# Patient Record
Sex: Male | Born: 1955 | Race: White | Hispanic: No | Smoking: Former smoker
Health system: Southern US, Community
[De-identification: ages and names within clinical notes are randomized; demographics above are authoritative.]

## PROBLEM LIST (undated history)

## (undated) DIAGNOSIS — K5730 Diverticulosis of large intestine without perforation or abscess without bleeding: Secondary | ICD-10-CM

## (undated) DIAGNOSIS — Z87898 Personal history of other specified conditions: Secondary | ICD-10-CM

## (undated) DIAGNOSIS — N400 Enlarged prostate without lower urinary tract symptoms: Secondary | ICD-10-CM

## (undated) DIAGNOSIS — E785 Hyperlipidemia, unspecified: Secondary | ICD-10-CM

## (undated) HISTORY — DX: Hyperlipidemia, unspecified: E78.5

## (undated) HISTORY — DX: Diverticulosis of large intestine without perforation or abscess without bleeding: K57.30

## (undated) HISTORY — PX: APPENDECTOMY: SHX54

## (undated) HISTORY — DX: Personal history of other specified conditions: Z87.898

## (undated) HISTORY — DX: Benign prostatic hyperplasia without lower urinary tract symptoms: N40.0

## (undated) HISTORY — PX: TONSILLECTOMY: SUR1361

---

## 2003-12-13 HISTORY — PX: COLONOSCOPY: SHX5424

## 2004-01-10 ENCOUNTER — Ambulatory Visit (HOSPITAL_COMMUNITY): Admission: RE | Admit: 2004-01-10 | Discharge: 2004-01-10 | Payer: Self-pay | Admitting: *Deleted

## 2005-03-12 ENCOUNTER — Ambulatory Visit: Payer: Self-pay | Admitting: Internal Medicine

## 2005-03-17 ENCOUNTER — Encounter (INDEPENDENT_AMBULATORY_CARE_PROVIDER_SITE_OTHER): Payer: Self-pay | Admitting: *Deleted

## 2005-03-17 ENCOUNTER — Ambulatory Visit: Payer: Self-pay | Admitting: Internal Medicine

## 2005-03-17 LAB — CONVERTED CEMR LAB: PSA: 1.69 ng/mL

## 2005-05-12 DIAGNOSIS — Z87898 Personal history of other specified conditions: Secondary | ICD-10-CM

## 2005-05-12 HISTORY — DX: Personal history of other specified conditions: Z87.898

## 2005-06-02 ENCOUNTER — Ambulatory Visit: Payer: Self-pay | Admitting: Internal Medicine

## 2005-06-05 ENCOUNTER — Ambulatory Visit: Payer: Self-pay | Admitting: Internal Medicine

## 2005-06-09 ENCOUNTER — Ambulatory Visit: Payer: Self-pay

## 2007-03-17 ENCOUNTER — Encounter (INDEPENDENT_AMBULATORY_CARE_PROVIDER_SITE_OTHER): Payer: Self-pay | Admitting: *Deleted

## 2007-03-17 DIAGNOSIS — Z9189 Other specified personal risk factors, not elsewhere classified: Secondary | ICD-10-CM | POA: Insufficient documentation

## 2007-03-18 ENCOUNTER — Ambulatory Visit: Payer: Self-pay | Admitting: Internal Medicine

## 2007-03-18 DIAGNOSIS — E782 Mixed hyperlipidemia: Secondary | ICD-10-CM | POA: Insufficient documentation

## 2007-03-18 DIAGNOSIS — N400 Enlarged prostate without lower urinary tract symptoms: Secondary | ICD-10-CM

## 2007-03-29 ENCOUNTER — Encounter (INDEPENDENT_AMBULATORY_CARE_PROVIDER_SITE_OTHER): Payer: Self-pay | Admitting: *Deleted

## 2007-10-19 ENCOUNTER — Ambulatory Visit: Payer: Self-pay | Admitting: Internal Medicine

## 2007-10-19 LAB — CONVERTED CEMR LAB
Glucose, Urine, Semiquant: NEGATIVE
Protein, U semiquant: NEGATIVE
Specific Gravity, Urine: <1.005
WBC Urine, dipstick: NEGATIVE
pH: 6.5

## 2007-11-09 ENCOUNTER — Telehealth (INDEPENDENT_AMBULATORY_CARE_PROVIDER_SITE_OTHER): Payer: Self-pay | Admitting: *Deleted

## 2008-03-26 ENCOUNTER — Ambulatory Visit: Payer: Self-pay | Admitting: Internal Medicine

## 2008-03-26 DIAGNOSIS — K5730 Diverticulosis of large intestine without perforation or abscess without bleeding: Secondary | ICD-10-CM | POA: Insufficient documentation

## 2008-03-27 ENCOUNTER — Encounter (INDEPENDENT_AMBULATORY_CARE_PROVIDER_SITE_OTHER): Payer: Self-pay | Admitting: *Deleted

## 2009-06-25 ENCOUNTER — Ambulatory Visit: Payer: Self-pay | Admitting: Internal Medicine

## 2010-02-09 LAB — CONVERTED CEMR LAB
AST: 18 units/L (ref 0–37)
BUN: 10 mg/dL (ref 6–23)
Basophils Absolute: 0 10*3/uL (ref 0.0–0.1)
Basophils Absolute: 0.1 10*3/uL (ref 0.0–0.1)
Basophils Relative: 0.5 % (ref 0.0–1.0)
Bilirubin, Direct: 0.1 mg/dL (ref 0.0–0.3)
Bilirubin, Direct: 0.1 mg/dL (ref 0.0–0.3)
CO2: 30 meq/L (ref 19–32)
Calcium: 9 mg/dL (ref 8.4–10.5)
Calcium: 9.1 mg/dL (ref 8.4–10.5)
Cholesterol, target level: 200 mg/dL
Cholesterol: 161 mg/dL (ref 0–200)
Cholesterol: 173 mg/dL (ref 0–200)
Eosinophils Absolute: 0.1 10*3/uL (ref 0.0–0.6)
Eosinophils Absolute: 0.1 10*3/uL (ref 0.0–0.7)
Eosinophils Relative: 1.9 % (ref 0.0–5.0)
GFR calc Af Amer: 130 mL/min
GFR calc Af Amer: 130 mL/min
GFR calc non Af Amer: 107 mL/min
GFR calc non Af Amer: 109.57 mL/min (ref >60)
Glucose, Bld: 91 mg/dL (ref 70–99)
Glucose, Bld: 96 mg/dL (ref 70–99)
HCT: 43.8 % (ref 39.0–52.0)
HDL: 42.1 mg/dL (ref >39.0)
HDL: 44.2 mg/dL (ref >39.0)
HDL: 44.7 mg/dL (ref >39.00)
Hemoglobin: 16.3 g/dL (ref 13.0–17.0)
Hemoglobin: 16.3 g/dL (ref 13.0–17.0)
LDL Cholesterol: 120 mg/dL — ABNORMAL HIGH (ref 0–99)
Lymphocytes Relative: 23.5 % (ref 12.0–46.0)
Lymphocytes Relative: 24.8 % (ref 12.0–46.0)
Lymphocytes Relative: 25.8 % (ref 12.0–46.0)
Lymphs Abs: 1.4 10*3/uL (ref 0.7–4.0)
MCHC: 34.8 g/dL (ref 30.0–36.0)
MCV: 89.2 fL (ref 78.0–100.0)
Monocytes Absolute: 0.5 10*3/uL (ref 0.1–1.0)
Monocytes Absolute: 0.6 10*3/uL (ref 0.2–0.7)
Monocytes Relative: 6.9 % (ref 3.0–12.0)
Neutro Abs: 4.6 10*3/uL (ref 1.4–7.7)
Neutro Abs: 4.8 10*3/uL (ref 1.4–7.7)
PSA: 1.33 ng/mL (ref 0.10–4.00)
Platelets: 231 10*3/uL (ref 150.0–400.0)
Potassium: 4.6 meq/L (ref 3.5–5.1)
RDW: 12.1 % (ref 11.5–14.6)
RDW: 13 % (ref 11.5–14.6)
Sodium: 140 meq/L (ref 135–145)
Sodium: 144 meq/L (ref 135–145)
TSH: 0.81 microintl units/mL (ref 0.35–5.50)
TSH: 1.08 microintl units/mL (ref 0.35–5.50)
TSH: 1.2 microintl units/mL (ref 0.35–5.50)
Total Bilirubin: 0.6 mg/dL (ref 0.3–1.2)
Total Bilirubin: 1 mg/dL (ref 0.3–1.2)
Total Protein: 6.9 g/dL (ref 6.0–8.3)
Triglycerides: 55 mg/dL (ref 0–149)
Triglycerides: 70 mg/dL (ref 0–149)
VLDL: 11.8 mg/dL (ref 0.0–40.0)
VLDL: 14 mg/dL (ref 0–40)
WBC: 7.1 10*3/uL (ref 4.5–10.5)

## 2010-02-11 NOTE — Assessment & Plan Note (Signed)
Summary: cpx//pt will come in fasting//lch   Vital Signs:  Patient profile:   55 year old male Height:      68 inches Weight:      197.8 pounds BMI:     30.18 Temp:     98.1 degrees F oral Pulse rate:   60 / minute Resp:     14 per minute BP sitting:   122 / 80  (left arm) Cuff size:   large  Vitals Entered By: Shonna Chock (June 25, 2009 8:44 AM)  CC: Lipid Management Comments REVIEWED MED LIST, PATIENT AGREED DOSE AND INSTRUCTION CORRECT    CC:  Lipid Management.  History of Present Illness: Jason Humphrey is here for a physical; he is asymptomatic.  Lipid Management History:      Positive NCEP/ATP III risk factors include male age 59 years old or older.  Negative NCEP/ATP III risk factors include non-diabetic, no family history for ischemic heart disease, non-tobacco-user status, non-hypertensive, no ASHD (atherosclerotic heart disease), no prior stroke/TIA, no peripheral vascular disease, and no history of aortic aneurysm.     Allergies: 1)  ! Jonne Ply With Caffiene 2)  ! Relafen  Past History:  Past Medical History: Hyperlipidemia: NMR 2007: LDL 126(1515/1163), HDL 41, TG 76. LDL goal = < 110, ideally < 85. Framingham Study LDL goal = < 160. Benign prostatic hypertrophy chest  pain 05/2005, Nuclear Stress Test negative  for ischemia (? mild LVH,? low normal EF) chondrocalcinosis Diverticulosis, colon  Past Surgical History: Appendectomy Tonsillectomy Colonoscopy Diverticulosis 12/2003 , Dr Sabino Gasser [facility] reviewed for relevance  Family History: Father: Parkinson's Disease Mother: peripheral neuropathy, HTN Siblings: sister: malignant  pituitary  tumor, deceased; sister: THR PGF:  Alcoholism; P uncles: HTN  Social History: Former Smoker:quit 1990 Occupation:[employer] Alcohol use-no Regular exercise-yes:2X/week as biking & rollerblading Married  Review of Systems  The patient denies anorexia, fever, weight loss, weight gain, vision loss, decreased  hearing, hoarseness, chest pain, syncope, dyspnea on exertion, peripheral edema, prolonged cough, headaches, hemoptysis, abdominal pain, melena, hematochezia, severe indigestion/heartburn, suspicious skin lesions, depression, unusual weight change, abnormal bleeding, enlarged lymph nodes, and angioedema.   GU:  Denies discharge, dysuria, hematuria, nocturia, urinary frequency, and urinary hesitancy; Symptoms improved on Green Tea & Saw Palmetto Extract.  Physical Exam  General:  well-nourished; alert,appropriate and cooperative throughout examination Head:  Normocephalic and atraumatic without obvious abnormalities. No apparent alopecia ; moustache. Eyes:  No corneal or conjunctival inflammation noted. Perrla. Funduscopic exam benign, without hemorrhages, exudates or papilledema. Ears:  External ear exam shows no significant lesions or deformities.  Otoscopic examination reveals clear canals, tympanic membranes are intact bilaterally without bulging, retraction, inflammation or discharge. Hearing is grossly normal bilaterally. Nose:  External nasal examination shows no deformity or inflammation. Nasal mucosa are pink and moist without lesions or exudates. Slight septal deviation Mouth:  Oral mucosa and oropharynx without lesions or exudates.  Teeth in good repair. Neck:  No deformities, masses, or tenderness noted. Lungs:  Normal respiratory effort, chest expands symmetrically. Lungs are clear to auscultation, no crackles or wheezes. Heart:  regular rhythm, no murmur, no gallop, no rub, no JVD, and bradycardia.   S4 Abdomen:  Bowel sounds positive,abdomen soft and non-tender without masses, organomegaly or hernias noted. Well healed op scar Rectal:  No external abnormalities noted. Normal sphincter tone. No rectal masses or tenderness. Genitalia:  Testes bilaterally descended without nodularity, tenderness or masses. No scrotal masses or lesions. No penis lesions or urethral discharge. Prostate:  no  nodules, no asymmetry, no induration, but  2+ enlarged.   Msk:  No deformity or scoliosis noted of thoracic or lumbar spine.   Pulses:  R and L carotid,radial,dorsalis pedis and posterior tibial pulses are full and equal bilaterally Extremities:  No clubbing, cyanosis, edema, or deformity noted with normal full range of motion of all joints.   Fungal nail changes Neurologic:  alert & oriented X3 and DTRs symmetrical and normal.   Skin:  Intact without suspicious lesions or rashes Cervical Nodes:  No lymphadenopathy noted Axillary Nodes:  No palpable lymphadenopathy Inguinal Nodes:  No significant adenopathy Psych:  memory intact for recent and remote, normally interactive, and good eye contact.     Impression & Recommendations:  Problem # 1:  ROUTINE GENERAL MEDICAL EXAM@HEALTH  CARE FACL (ICD-V70.0)  Orders: EKG w/ Interpretation (93000) Venipuncture (04540) TLB-Lipid Panel (80061-LIPID) TLB-BMP (Basic Metabolic Panel-BMET) (80048-METABOL) TLB-CBC Platelet - w/Differential (85025-CBCD) TLB-Hepatic/Liver Function Pnl (80076-HEPATIC) TLB-TSH (Thyroid Stimulating Hormone) (84443-TSH) TLB-PSA (Prostate Specific Antigen) (84153-PSA)  Problem # 2:  HYPERLIPIDEMIA (ICD-272.2)  Orders: EKG w/ Interpretation (93000) Venipuncture (98119) TLB-Lipid Panel (80061-LIPID)  Problem # 3:  BENIGN PROSTATIC HYPERTROPHY (ICD-600.00)  His updated medication list for this problem includes:    Flomax 0.4 Mg Xr24h-cap (Tamsulosin hcl) .Marland Kitchen... 1 by mouth every other day  Orders: Venipuncture (14782) TLB-PSA (Prostate Specific Antigen) (84153-PSA)  Problem # 4:  DIVERTICULOSIS, COLON (ICD-562.10)  Complete Medication List: 1)  Flomax 0.4 Mg Xr24h-cap (Tamsulosin hcl) .Marland Kitchen.. 1 by mouth every other day  Lipid Assessment/Plan:      Based on NCEP/ATP III, the patient's risk factor category is "0-1 risk factors".  The patient's lipid goals are as follows: Total cholesterol goal is 200; LDL cholesterol  goal is 110; HDL cholesterol goal is 40; Triglyceride goal is 150.  His LDL cholesterol goal has been met.  Secondary causes for hyperlipidemia have been ruled out.  He has been counseled on adjunctive measures for lowering his cholesterol and has been provided with dietary instructions.    Patient Instructions: 1)  It is important that you exercise regularly at least 20 minutes 5 times a week. If you develop chest pain, have severe difficulty breathing, or feel very tired , stop exercising immediately and seek medical attention. 2)  Take an 81 mg  coated Aspirin every day.

## 2010-05-30 NOTE — Op Note (Signed)
[name], Jason Humphrey NO.:  [id number]   MEDICAL RECORD NO.:  [id number]          PATIENT TYPE:  AMB   LOCATION:  ENDO                         FACILITY:  [facility]   PHYSICIAN:  Georgiana Spinner, M.D.    DATE OF BIRTH:  1955/11/27   DATE OF PROCEDURE:  01/10/2004  DATE OF DISCHARGE:                                 OPERATIVE REPORT   PROCEDURE:  Colonoscopy.   INDICATIONS:  Colon cancer screening.   ANESTHESIA:  Demerol 75 mg, Versed 10 mg.   PROCEDURE:  With the patient mildly stated in the left lateral decubitus  position, a rectal examination was performed which was unremarkable.  The  prostate felt normal.  Subsequently the Olympus video colonoscope was  inserted in the rectum and passed under direct vision to the cecum,  identified by ileocecal valve and appendiceal orifice, both of which were  photographed.  From this point the colonoscope was slowly withdrawn taking  circumferential views of the colonic mucosa, stopping only in the sigmoid  colon to photograph an area of diverticulosis that was noted, fairly mild,  until we reached the rectum which appeared normal, in the rectum it showed  hemorrhoids on retroflex view.  The endoscope was straightened and  withdrawn.  The patient's vital signs and pulses remained stable.  The  patient tolerated the procedure well and there were no apparent  complications.   FINDINGS:  Diverticulosis of sigmoid colon, internal hemorrhoids, otherwise  an unremarkable examination.   PLAN:  Consider repeat examination in 5 to 10 years.       GMO/MEDQ  D:  01/10/2004  T:  01/10/2004  Job:  [id number]   cc:   Titus Dubin. Alwyn Ren, M.D. [facility]

## 2010-07-26 ENCOUNTER — Encounter: Payer: Self-pay | Admitting: Internal Medicine

## 2010-07-30 ENCOUNTER — Encounter: Payer: Self-pay | Admitting: Internal Medicine

## 2011-09-07 ENCOUNTER — Encounter (HOSPITAL_COMMUNITY): Payer: Self-pay | Admitting: *Deleted

## 2011-09-07 ENCOUNTER — Emergency Department (HOSPITAL_COMMUNITY)
Admission: EM | Admit: 2011-09-07 | Discharge: 2011-09-07 | Disposition: A | Discharge status: Home / Self Care | Payer: Self-pay | Attending: Emergency Medicine | Admitting: Emergency Medicine

## 2011-09-07 DIAGNOSIS — E785 Hyperlipidemia, unspecified: Secondary | ICD-10-CM | POA: Insufficient documentation

## 2011-09-07 DIAGNOSIS — K644 Residual hemorrhoidal skin tags: Secondary | ICD-10-CM | POA: Insufficient documentation

## 2011-09-07 DIAGNOSIS — Z87891 Personal history of nicotine dependence: Secondary | ICD-10-CM | POA: Insufficient documentation

## 2011-09-07 DIAGNOSIS — K649 Unspecified hemorrhoids: Secondary | ICD-10-CM

## 2011-09-07 DIAGNOSIS — N400 Enlarged prostate without lower urinary tract symptoms: Secondary | ICD-10-CM | POA: Insufficient documentation

## 2011-09-07 MED ORDER — HYDROCORTISONE ACE-PRAMOXINE 1-1 % RE FOAM: 1.0000 | Freq: Two times a day (BID) | RECTAL | Status: AC

## 2011-09-07 NOTE — ED Provider Notes (Signed)
History     CSN: [id number]  Arrival date & time 09/07/11  8295   First MD Initiated Contact with Patient 09/07/11 2019      Chief Complaint  Patient presents with  . Rectal Bleeding    (Consider location/radiation/quality/duration/timing/severity/associated sxs/prior treatment) Patient is a 56 y.o. male presenting with hematochezia. The history is provided by the patient.  Rectal Bleeding    patient here with bright red blood noted on toilet paper. History of hemorrhoids in the past denies any abdominal pain or dizziness with standing. Was attempting to move his bowels today was having increased constipation when he had sudden gush of bright red blood which has since resolved. Denies any vomiting or fever.  Past Medical History  Diagnosis Date  . Hyperlipidemia   . Benign prostatic hypertrophy   . Diverticulosis of colon   . History of chest pain 05/2005    Nuclear stress test neg for ischemia (?mild LVH, ? low normal EF)    Past Surgical History  Procedure Date  . Appendectomy   . Tonsillectomy   . Colonoscopy 12/2003    Diverticulosis, Dr.George Virginia [place]     Family History  Problem Relation Age of Onset  . Parkinsonism Father   . Neuropathy Mother   . Hypertension Mother   . Alcohol abuse Paternal Grandfather   . Hypertension Paternal Uncle     History  Substance Use Topics  . Smoking status: Former Smoker    Quit date: 01/13/1988  . Smokeless tobacco: Not on file  . Alcohol Use: No      Review of Systems  Gastrointestinal: Positive for hematochezia.  All other systems reviewed and are negative.    Allergies  Nabumetone  Home Medications  No current outpatient prescriptions on file.  BP 113/85  Pulse 77  Temp 98.4 F (36.9 C) (Oral)  Resp 16  Ht 5\' 10"  (1.778 m)  Wt 180 lb (81.647 kg)  BMI 25.83 kg/m2  SpO2 97%  Physical Exam  Nursing note and vitals reviewed. Constitutional: He is oriented to person, place, and time. He appears  well-developed and well-nourished.  Non-toxic appearance. No distress.  HENT:  Head: Normocephalic and atraumatic.  Eyes: Conjunctivae, EOM and lids are normal. Pupils are equal, round, and reactive to light.  Neck: Normal range of motion. Neck supple. No tracheal deviation present. No mass present.  Cardiovascular: Normal rate, regular rhythm and normal heart sounds.  Exam reveals no gallop.   No murmur heard. Pulmonary/Chest: Effort normal and breath sounds normal. No stridor. No respiratory distress. He has no decreased breath sounds. He has no wheezes. He has no rhonchi. He has no rales.  Abdominal: Soft. Normal appearance and bowel sounds are normal. He exhibits no distension. There is no tenderness. There is no rebound and no CVA tenderness.  Genitourinary:       Moderate size external hemorrhoid noted. Dried blood appreciated with no active bleeding. Good sphincter tone  Musculoskeletal: Normal range of motion. He exhibits no edema and no tenderness.  Neurological: He is alert and oriented to person, place, and time. He has normal strength. No cranial nerve deficit or sensory deficit. GCS eye subscore is 4. GCS verbal subscore is 5. GCS motor subscore is 6.  Skin: Skin is warm and dry. No abrasion and no rash noted.  Psychiatric: He has a normal mood and affect. His speech is normal and behavior is normal.    ED Course  Procedures (including critical care time)  Labs Reviewed -  No data to display No results found.   No diagnosis found.    MDM  Patient to be treated for hemorrhoids and given referral to surgery. We'll also give patient Anusol and Proctofoam.        Toy Baker, MD 09/07/11 [phone]

## 2011-09-07 NOTE — ED Notes (Signed)
Pt from home with hx of hemmoroids years ago sts he saw bright red blood in the toilet earlier today and has seen blood on the toilet paper x 1 week. Sts since last week he has a place on his rectum that has been irritated. Pt sts he has done no heavy lifting recently.

## 2013-01-03 ENCOUNTER — Encounter (HOSPITAL_COMMUNITY): Payer: Self-pay | Admitting: Emergency Medicine

## 2013-01-03 ENCOUNTER — Emergency Department (HOSPITAL_COMMUNITY)
Admission: EM | Admit: 2013-01-03 | Discharge: 2013-01-03 | Disposition: A | Discharge status: Home / Self Care | Payer: Self-pay | Attending: Emergency Medicine | Admitting: Emergency Medicine

## 2013-01-03 DIAGNOSIS — R42 Dizziness and giddiness: Secondary | ICD-10-CM | POA: Insufficient documentation

## 2013-01-03 DIAGNOSIS — Z87891 Personal history of nicotine dependence: Secondary | ICD-10-CM | POA: Insufficient documentation

## 2013-01-03 DIAGNOSIS — Z87448 Personal history of other diseases of urinary system: Secondary | ICD-10-CM | POA: Insufficient documentation

## 2013-01-03 DIAGNOSIS — Z862 Personal history of diseases of the blood and blood-forming organs and certain disorders involving the immune mechanism: Secondary | ICD-10-CM | POA: Insufficient documentation

## 2013-01-03 DIAGNOSIS — Z8719 Personal history of other diseases of the digestive system: Secondary | ICD-10-CM | POA: Insufficient documentation

## 2013-01-03 DIAGNOSIS — Z8639 Personal history of other endocrine, nutritional and metabolic disease: Secondary | ICD-10-CM | POA: Insufficient documentation

## 2013-01-03 DIAGNOSIS — R5381 Other malaise: Secondary | ICD-10-CM | POA: Insufficient documentation

## 2013-01-03 MED ORDER — ONDANSETRON HCL 4 MG/2ML IJ SOLN
4.0000 mg | Freq: Once | INTRAMUSCULAR | Status: AC
  Administered 2013-01-03: 4 mg via INTRAVENOUS
  Filled 2013-01-03: qty 2

## 2013-01-03 MED ORDER — PROMETHAZINE HCL 25 MG PO TABS: 25.0000 mg | ORAL_TABLET | Freq: Three times a day (TID) | ORAL | Status: DC | PRN

## 2013-01-03 MED ORDER — SODIUM CHLORIDE 0.9 % IV BOLUS (SEPSIS)
1000.0000 mL | Freq: Once | INTRAVENOUS | Status: AC
  Administered 2013-01-03: 1000 mL via INTRAVENOUS

## 2013-01-03 MED ORDER — MECLIZINE HCL 25 MG PO TABS
25.0000 mg | ORAL_TABLET | Freq: Once | ORAL | Status: AC
  Administered 2013-01-03: 25 mg via ORAL
  Filled 2013-01-03: qty 1

## 2013-01-03 MED ORDER — MECLIZINE HCL 25 MG PO TABS: ORAL_TABLET | ORAL | Status: DC

## 2013-01-03 NOTE — ED Provider Notes (Signed)
CSN: [id number]     Arrival date & time 01/03/13  0913 History   First MD Initiated Contact with Patient 01/03/13 [phone]     Chief Complaint  Patient presents with  . Nausea   (Consider location/radiation/quality/duration/timing/severity/associated sxs/prior Treatment) HPI Patient states yesterday about 12:30 in the afternoon he started getting gradually worsening nausea with Annice Pih. He also states he feels weak and dizzy. He states he can't stay in the because he feels like he is spinning. He states if he moves his head rapidly spinning gets worse. He states if he lays still it feels better. He denies any headache. He states he had double vision yesterday but not today. He denies numbness in his extremities. He states when he walks he feels off balance. He denies chest pain, shortness of breath, abdominal pain, or diarrhea. He states he feels off balance when he tries to walk. He states he had episode of vertigo about 15 years ago that was much more intense with a spinning than what he's having today.  Family history is negative for strokes  PCP Dr Alwyn Ren hasn't seen in years  Past Medical History  Diagnosis Date  . Hyperlipidemia   . Benign prostatic hypertrophy   . Diverticulosis of colon   . History of chest pain 05/2005    Nuclear stress test neg for ischemia (?mild LVH, ? low normal EF)   Past Surgical History  Procedure Laterality Date  . Appendectomy    . Tonsillectomy    . Colonoscopy  12/2003    Diverticulosis, Dr.George Virginia [place]    Family History  Problem Relation Age of Onset  . Parkinsonism Father   . Neuropathy Mother   . Hypertension Mother   . Alcohol abuse Paternal Grandfather   . Hypertension Paternal Uncle    History  Substance Use Topics  . Smoking status: Former Smoker    Quit date: 01/13/1988  . Smokeless tobacco: Not on file  . Alcohol Use: No  unemployed dump [employer]  Review of Systems  All other systems reviewed and are negative.    Allergies   Nabumetone  Home Medications  No current outpatient prescriptions on file.   BP 159/84  Pulse 89  Temp(Src) 97.5 F (36.4 C) (Oral)  SpO2 95%  Vital signs normal   Physical Exam  Nursing note and vitals reviewed. Constitutional: He is oriented to person, place, and time. He appears well-developed and well-nourished.  Non-toxic appearance. He does not appear ill. No distress.  HENT:  Head: Normocephalic and atraumatic.  Right Ear: External ear normal.  Left Ear: External ear normal.  Nose: Nose normal. No mucosal edema or rhinorrhea.  Mouth/Throat: Oropharynx is clear and moist and mucous membranes are normal. No dental abscesses or uvula swelling.  Eyes: Conjunctivae and EOM are normal. Pupils are equal, round, and reactive to light.  Rare nystagmus seen when patient sits up  Neck: Normal range of motion and full passive range of motion without pain. Neck supple.  Cardiovascular: Normal rate, regular rhythm and normal heart sounds.  Exam reveals no gallop and no friction rub.   No murmur heard. Pulmonary/Chest: Effort normal and breath sounds normal. No respiratory distress. He has no wheezes. He has no rhonchi. He has no rales. He exhibits no tenderness and no crepitus.  Abdominal: Soft. Normal appearance and bowel sounds are normal. He exhibits no distension. There is no tenderness. There is no rebound and no guarding.  Musculoskeletal: Normal range of motion. He exhibits no edema and  no tenderness.  Moves all extremities well.   Neurological: He is alert and oriented to person, place, and time. He has normal strength. No cranial nerve deficit.  Skin: Skin is warm, dry and intact. No rash noted. No erythema. No pallor.  Psychiatric: He has a normal mood and affect. His speech is normal and behavior is normal. His mood appears not anxious.  Flat affect    ED Course  Procedures (including critical care time)  Medications  sodium chloride 0.9 % bolus 1,000 mL (0 mLs  Intravenous Stopped 01/03/13 1130)  ondansetron (ZOFRAN) injection 4 mg (4 mg Intravenous Given 01/03/13 0956)  meclizine (ANTIVERT) tablet 25 mg (25 mg Oral Given 01/03/13 0956)  ondansetron (ZOFRAN) injection 4 mg (4 mg Intravenous Given 01/03/13 1126)  meclizine (ANTIVERT) tablet 25 mg (25 mg Oral Given 01/03/13 1126)    11:10 Feeling better, still has mild nausea and mild dizziness when he moves his head.   11:30 pt ambulated by nursing staff without difficulty.   Labs Review Labs Reviewed - No data to display Imaging Review No results found.  EKG Interpretation   None       MDM   1. Vertigo     New Prescriptions   MECLIZINE (ANTIVERT) 25 MG TABLET    Take 1 or 2 po Q 6hrs for dizziness   PROMETHAZINE (PHENERGAN) 25 MG TABLET    Take 1 tablet (25 mg total) by mouth every 8 (eight) hours as needed for nausea or vomiting.    Plan discharge   Devoria Albe, MD, Franz Dell, MD 01/03/13 1213

## 2013-01-03 NOTE — ED Notes (Addendum)
Pt arrived EMS with complaints of nausea for the 24 hours. Pt denies dizziness, vomitiming, illness, injury, or LOC.

## 2013-01-03 NOTE — ED Notes (Signed)
Pt ambulated in hallway to bathroom w/o assistance.

## 2013-01-03 NOTE — ED Notes (Signed)
Bed: RESB Expected date:  Expected time:  Means of arrival:  Comments: Nausea, dizziness/to go to 14 when DC

## 2013-01-03 NOTE — Progress Notes (Signed)
[facility] provided pt with a list of primary care resources, a [facility] [employer] Card application, and [facility] information.  °

## 2016-09-12 ENCOUNTER — Emergency Department (HOSPITAL_COMMUNITY): Payer: Self-pay

## 2016-09-12 ENCOUNTER — Encounter (HOSPITAL_COMMUNITY): Payer: Self-pay | Admitting: *Deleted

## 2016-09-12 ENCOUNTER — Emergency Department (HOSPITAL_COMMUNITY): Admit: 2016-09-12 | Payer: Self-pay

## 2016-09-12 ENCOUNTER — Ambulatory Visit (HOSPITAL_COMMUNITY)
Admission: EM | Admit: 2016-09-12 | Discharge: 2016-09-12 | Disposition: A | Discharge status: Home / Self Care | Payer: Self-pay | Attending: Nurse Practitioner | Admitting: Nurse Practitioner

## 2016-09-12 ENCOUNTER — Inpatient Hospital Stay (HOSPITAL_COMMUNITY)
Admission: EM | Admit: 2016-09-12 | Discharge: 2016-09-16 | DRG: 812 | Disposition: A | Discharge status: Home / Self Care | Payer: Self-pay | Attending: Internal Medicine | Admitting: Internal Medicine

## 2016-09-12 ENCOUNTER — Emergency Department (HOSPITAL_COMMUNITY): Admit: 2016-09-12 | Discharge: 2016-09-12 | Disposition: A | Discharge status: Home / Self Care | Payer: Self-pay

## 2016-09-12 ENCOUNTER — Encounter (HOSPITAL_COMMUNITY): Payer: Self-pay | Admitting: Family Medicine

## 2016-09-12 DIAGNOSIS — N179 Acute kidney failure, unspecified: Secondary | ICD-10-CM | POA: Diagnosis present

## 2016-09-12 DIAGNOSIS — K5790 Diverticulosis of intestine, part unspecified, without perforation or abscess without bleeding: Secondary | ICD-10-CM | POA: Insufficient documentation

## 2016-09-12 DIAGNOSIS — E785 Hyperlipidemia, unspecified: Secondary | ICD-10-CM | POA: Insufficient documentation

## 2016-09-12 DIAGNOSIS — E876 Hypokalemia: Secondary | ICD-10-CM | POA: Diagnosis present

## 2016-09-12 DIAGNOSIS — E861 Hypovolemia: Secondary | ICD-10-CM | POA: Diagnosis present

## 2016-09-12 DIAGNOSIS — R600 Localized edema: Secondary | ICD-10-CM | POA: Diagnosis present

## 2016-09-12 DIAGNOSIS — Z8249 Family history of ischemic heart disease and other diseases of the circulatory system: Secondary | ICD-10-CM

## 2016-09-12 DIAGNOSIS — E872 Acidosis: Secondary | ICD-10-CM | POA: Diagnosis present

## 2016-09-12 DIAGNOSIS — M7989 Other specified soft tissue disorders: Secondary | ICD-10-CM | POA: Insufficient documentation

## 2016-09-12 DIAGNOSIS — D649 Anemia, unspecified: Principal | ICD-10-CM | POA: Diagnosis present

## 2016-09-12 DIAGNOSIS — R0602 Shortness of breath: Secondary | ICD-10-CM | POA: Insufficient documentation

## 2016-09-12 DIAGNOSIS — N400 Enlarged prostate without lower urinary tract symptoms: Secondary | ICD-10-CM | POA: Diagnosis present

## 2016-09-12 DIAGNOSIS — R17 Unspecified jaundice: Secondary | ICD-10-CM

## 2016-09-12 DIAGNOSIS — R609 Edema, unspecified: Secondary | ICD-10-CM

## 2016-09-12 DIAGNOSIS — Z87891 Personal history of nicotine dependence: Secondary | ICD-10-CM

## 2016-09-12 DIAGNOSIS — E871 Hypo-osmolality and hyponatremia: Secondary | ICD-10-CM | POA: Diagnosis present

## 2016-09-12 DIAGNOSIS — I959 Hypotension, unspecified: Secondary | ICD-10-CM | POA: Diagnosis present

## 2016-09-12 DIAGNOSIS — R531 Weakness: Secondary | ICD-10-CM

## 2016-09-12 DIAGNOSIS — E860 Dehydration: Secondary | ICD-10-CM

## 2016-09-12 DIAGNOSIS — R42 Dizziness and giddiness: Secondary | ICD-10-CM | POA: Insufficient documentation

## 2016-09-12 LAB — COMPREHENSIVE METABOLIC PANEL
ALT: 20 U/L (ref 17–63)
ALT: 17 U/L (ref 17–63)
ANION GAP: 15 (ref 5–15)
AST: 50 U/L — AB (ref 15–41)
AST: 26 U/L (ref 15–41)
Albumin: 3 g/dL — ABNORMAL LOW (ref 3.5–5.0)
Albumin: 3 g/dL — ABNORMAL LOW (ref 3.5–5.0)
Alkaline Phosphatase: 78 U/L (ref 38–126)
Alkaline Phosphatase: 71 U/L (ref 38–126)
Anion gap: 17 — ABNORMAL HIGH (ref 5–15)
BILIRUBIN TOTAL: 3.3 mg/dL — AB (ref 0.3–1.2)
BUN: 23 mg/dL — ABNORMAL HIGH (ref 6–20)
BUN: 25 mg/dL — ABNORMAL HIGH (ref 6–20)
CO2: 14 mmol/L — ABNORMAL LOW (ref 22–32)
CO2: 15 mmol/L — ABNORMAL LOW (ref 22–32)
CREATININE: 1.51 mg/dL — AB (ref 0.61–1.24)
Calcium: 8.5 mg/dL — ABNORMAL LOW (ref 8.9–10.3)
Calcium: 8.2 mg/dL — ABNORMAL LOW (ref 8.9–10.3)
Chloride: 99 mmol/L — ABNORMAL LOW (ref 101–111)
Chloride: 99 mmol/L — ABNORMAL LOW (ref 101–111)
Creatinine, Ser: 1.6 mg/dL — ABNORMAL HIGH (ref 0.61–1.24)
GFR calc Af Amer: 52 mL/min — ABNORMAL LOW (ref >60)
GFR calc non Af Amer: 45 mL/min — ABNORMAL LOW (ref >60)
GFR, EST AFRICAN AMERICAN: 55 mL/min — AB (ref >60)
GFR, EST NON AFRICAN AMERICAN: 48 mL/min — AB (ref >60)
Glucose, Bld: 135 mg/dL — ABNORMAL HIGH (ref 65–99)
Glucose, Bld: 115 mg/dL — ABNORMAL HIGH (ref 65–99)
POTASSIUM: 4.4 mmol/L (ref 3.5–5.1)
POTASSIUM: 4.4 mmol/L (ref 3.5–5.1)
Sodium: 130 mmol/L — ABNORMAL LOW (ref 135–145)
Sodium: 129 mmol/L — ABNORMAL LOW (ref 135–145)
TOTAL PROTEIN: 6.7 g/dL (ref 6.5–8.1)
Total Bilirubin: 2.9 mg/dL — ABNORMAL HIGH (ref 0.3–1.2)
Total Protein: 6.4 g/dL — ABNORMAL LOW (ref 6.5–8.1)

## 2016-09-12 LAB — AMMONIA: Ammonia: 24 umol/L (ref 9–35)

## 2016-09-12 LAB — PROTIME-INR
INR: 1.27
Prothrombin Time: 15.8 seconds — ABNORMAL HIGH (ref 11.4–15.2)

## 2016-09-12 LAB — I-STAT CHEM 8, ED
BUN: 23 mg/dL — ABNORMAL HIGH (ref 6–20)
Calcium, Ion: 1.05 mmol/L — ABNORMAL LOW (ref 1.15–1.40)
Chloride: 101 mmol/L (ref 101–111)
Creatinine, Ser: 1.2 mg/dL (ref 0.61–1.24)
Glucose, Bld: 114 mg/dL — ABNORMAL HIGH (ref 65–99)
HCT: 27 % — ABNORMAL LOW (ref 39.0–52.0)
HEMOGLOBIN: 9.2 g/dL — AB (ref 13.0–17.0)
Potassium: 4.1 mmol/L (ref 3.5–5.1)
Sodium: 131 mmol/L — ABNORMAL LOW (ref 135–145)
TCO2: 15 mmol/L — ABNORMAL LOW (ref 22–32)

## 2016-09-12 LAB — URINALYSIS, ROUTINE W REFLEX MICROSCOPIC
Bilirubin Urine: NEGATIVE
Glucose, UA: 50 mg/dL — AB
Ketones, ur: 20 mg/dL — AB
LEUKOCYTES UA: NEGATIVE
Nitrite: NEGATIVE
PROTEIN: NEGATIVE mg/dL
SPECIFIC GRAVITY, URINE: 1.03 (ref 1.005–1.030)
SQUAMOUS EPITHELIAL / LPF: NONE SEEN
pH: 5 (ref 5.0–8.0)

## 2016-09-12 LAB — I-STAT VENOUS BLOOD GAS, ED
ACID-BASE DEFICIT: 8 mmol/L — AB (ref 0.0–2.0)
Acid-base deficit: 11 mmol/L — ABNORMAL HIGH (ref 0.0–2.0)
BICARBONATE: 15.7 mmol/L — AB (ref 20.0–28.0)
Bicarbonate: 13.6 mmol/L — ABNORMAL LOW (ref 20.0–28.0)
O2 Saturation: 90 %
O2 Saturation: 36 %
PH VEN: 7.314 (ref 7.250–7.430)
PH VEN: 7.393 (ref 7.250–7.430)
TCO2: 14 mmol/L — ABNORMAL LOW (ref 22–32)
TCO2: 16 mmol/L — AB (ref 22–32)
pCO2, Ven: 26.9 mmHg — ABNORMAL LOW (ref 44.0–60.0)
pCO2, Ven: 25.8 mmHg — ABNORMAL LOW (ref 44.0–60.0)
pO2, Ven: 62 mmHg — ABNORMAL HIGH (ref 32.0–45.0)
pO2, Ven: 21 mmHg — CL (ref 32.0–45.0)

## 2016-09-12 LAB — LIPASE, BLOOD: LIPASE: 33 U/L (ref 11–51)

## 2016-09-12 LAB — CBG MONITORING, ED: GLUCOSE-CAPILLARY: 95 mg/dL (ref 65–99)

## 2016-09-12 LAB — CBC
HCT: 27.1 % — ABNORMAL LOW (ref 39.0–52.0)
HEMOGLOBIN: 8.2 g/dL — AB (ref 13.0–17.0)
MCH: 25 pg — ABNORMAL LOW (ref 26.0–34.0)
MCHC: 30.3 g/dL (ref 30.0–36.0)
MCV: 82.6 fL (ref 78.0–100.0)
Platelets: 440 10*3/uL — ABNORMAL HIGH (ref 150–400)
RBC: 3.28 MIL/uL — ABNORMAL LOW (ref 4.22–5.81)
RDW: 17 % — AB (ref 11.5–15.5)
WBC: 7.2 10*3/uL (ref 4.0–10.5)

## 2016-09-12 LAB — CBC WITH DIFFERENTIAL/PLATELET
Basophils Absolute: 0 10*3/uL (ref 0.0–0.1)
Basophils Relative: 0 %
Eosinophils Absolute: 0 10*3/uL (ref 0.0–0.7)
Eosinophils Relative: 0 %
HCT: 27.7 % — ABNORMAL LOW (ref 39.0–52.0)
Hemoglobin: 8.4 g/dL — ABNORMAL LOW (ref 13.0–17.0)
LYMPHS ABS: 0.9 10*3/uL (ref 0.7–4.0)
LYMPHS PCT: 11 %
MCH: 24.9 pg — AB (ref 26.0–34.0)
MCHC: 30.3 g/dL (ref 30.0–36.0)
MCV: 82.2 fL (ref 78.0–100.0)
Monocytes Absolute: 0.5 10*3/uL (ref 0.1–1.0)
Monocytes Relative: 6 %
NEUTROS ABS: 6.2 10*3/uL (ref 1.7–7.7)
Neutrophils Relative %: 83 %
Platelets: 553 10*3/uL — ABNORMAL HIGH (ref 150–400)
RBC: 3.37 MIL/uL — ABNORMAL LOW (ref 4.22–5.81)
RDW: 17.2 % — ABNORMAL HIGH (ref 11.5–15.5)
WBC: 7.5 10*3/uL (ref 4.0–10.5)

## 2016-09-12 LAB — I-STAT CG4 LACTIC ACID, ED
LACTIC ACID, VENOUS: 4.24 mmol/L — AB (ref 0.5–1.9)
Lactic Acid, Venous: 2.65 mmol/L (ref 0.5–1.9)

## 2016-09-12 LAB — I-STAT TROPONIN, ED: TROPONIN I, POC: 0 ng/mL (ref 0.00–0.08)

## 2016-09-12 LAB — GAMMA GT: GGT: 20 U/L (ref 7–50)

## 2016-09-12 LAB — D-DIMER, QUANTITATIVE (NOT AT ARMC): D DIMER QUANT: 1.42 ug{FEU}/mL — AB (ref 0.00–0.50)

## 2016-09-12 LAB — BRAIN NATRIURETIC PEPTIDE: B Natriuretic Peptide: 67.9 pg/mL (ref 0.0–100.0)

## 2016-09-12 MED ORDER — IOPAMIDOL (ISOVUE-300) INJECTION 61%
INTRAVENOUS | Status: AC
  Administered 2016-09-12: 80 mL
  Filled 2016-09-12: qty 100

## 2016-09-12 MED ORDER — SODIUM CHLORIDE 0.9 % IV BOLUS (SEPSIS)
1000.0000 mL | Freq: Once | INTRAVENOUS | Status: AC
  Administered 2016-09-12: 1000 mL via INTRAVENOUS

## 2016-09-12 MED ORDER — LACTATED RINGERS IV BOLUS (SEPSIS)
1000.0000 mL | Freq: Once | INTRAVENOUS | Status: AC
  Administered 2016-09-12: 1000 mL via INTRAVENOUS

## 2016-09-12 MED ORDER — DEXTROSE 5 % IV SOLN
1.0000 g | Freq: Once | INTRAVENOUS | Status: AC
  Administered 2016-09-12: 1 g via INTRAVENOUS
  Filled 2016-09-12: qty 10

## 2016-09-12 MED ORDER — HEPARIN SODIUM (PORCINE) 5000 UNIT/ML IJ SOLN
5000.0000 [IU] | Freq: Three times a day (TID) | INTRAMUSCULAR | Status: DC
  Administered 2016-09-13 – 2016-09-16 (×10): 5000 [IU] via SUBCUTANEOUS
  Filled 2016-09-12 (×10): qty 1

## 2016-09-12 NOTE — ED Provider Notes (Signed)
[place] DEPT Provider Note   CSN: [id number] Arrival date & time: 09/12/16  1813     History   Chief Complaint Chief Complaint  Patient presents with  . Hypotension  . Leg Swelling    HPI Jason Humphrey is a 61 y.o. male.  This is a 61 year old male who denies any PMH except for BPH who presents from urgent care due to neighbors concern for well-being.  The patient presents with his neighbor via ambulance after presenting to urgent care being found to be hypotensive with systolic blood pressure 85, and short of breath. He was placed on nasal cannula and sent to our ED for further evaluation.  He denies any fevers, change in bowel movements, urinary symptoms.  He denies any change in skin color subjectively.  His main complaint is generalized weakness, decreased appetite, difficulty ambulating and home due to his tense leg swelling.  He denies any recent falls or head injury.  Denies any chest pain, abdominal pain, visual changes.  Denies any back pain. He states he can only walk short distances at home due to pain in his lower extremities and weakness.  He denies alcohol use or drug use.  Denies being on any medications currently.  He has not seen a physician in years.      Past Medical History:  Diagnosis Date  . Benign prostatic hypertrophy   . Diverticulosis of colon   . History of chest pain 05/2005   Nuclear stress test neg for ischemia (?mild LVH, ? low normal EF)  . Hyperlipidemia     Patient Active Problem List   Diagnosis Date Noted  . DIVERTICULOSIS, COLON 03/26/2008  . HYPERLIPIDEMIA 03/18/2007  . BENIGN PROSTATIC HYPERTROPHY 03/18/2007  . CHEST PAIN, ATYPICAL, HX OF 03/17/2007    Past Surgical History:  Procedure Laterality Date  . APPENDECTOMY    . COLONOSCOPY  12/2003   Diverticulosis, Dr.George Lajoyce [place]ers   . TONSILLECTOMY         Home Medications    Prior to Admission medications   Medication Sig Start Date End Date Taking? Authorizing  Provider  meclizine (ANTIVERT) 25 MG tablet Take 1 or 2 po Q 6hrs for dizziness Patient not taking: Reported on 09/12/2016 01/03/13   Rolland Porter, MD  promethazine (PHENERGAN) 25 MG tablet Take 1 tablet (25 mg total) by mouth every 8 (eight) hours as needed for nausea or vomiting. Patient not taking: Reported on 09/12/2016 01/03/13   Rolland Porter, MD    Family History Family History  Problem Relation Age of Onset  . Parkinsonism Father   . Neuropathy Mother   . Hypertension Mother   . Alcohol abuse Paternal Grandfather   . Hypertension Paternal Uncle     Social History Social History  Substance Use Topics  . Smoking status: Former Smoker    Quit date: 01/13/1988  . Smokeless tobacco: Never Used  . Alcohol use No     Allergies   Nabumetone   Review of Systems Review of Systems  Constitutional: Positive for activity change, appetite change, fatigue and unexpected weight change. Negative for chills, diaphoresis and fever.  HENT: Negative for ear pain and sore throat.   Eyes: Negative for pain and visual disturbance.  Respiratory: Negative for cough, chest tightness, shortness of breath and wheezing.   Cardiovascular: Positive for leg swelling. Negative for chest pain and palpitations.  Gastrointestinal: Negative for abdominal pain, blood in stool, constipation, diarrhea and vomiting.  Genitourinary: Negative for dysuria and hematuria.  Musculoskeletal: Positive  for gait problem. Negative for arthralgias, back pain, joint swelling and myalgias.  Skin: Negative for color change and rash.  Neurological: Positive for tremors and weakness. Negative for seizures, syncope, numbness and headaches.  All other systems reviewed and are negative.  Physical Exam Updated Vital Signs BP 127/76   Pulse 91   Temp 97.9 F (36.6 C) (Oral)   Resp 14   SpO2 100%   Physical Exam  Constitutional: He appears well-developed and well-nourished.  HENT:  Head: Normocephalic and atraumatic.  Eyes:  Pupils are equal, round, and reactive to light. Conjunctivae are normal. Scleral icterus is present.  Neck: Normal range of motion. Neck supple.  Cardiovascular: Normal rate and regular rhythm.   No murmur heard. Pulmonary/Chest: Effort normal and breath sounds normal. No respiratory distress. He exhibits no tenderness.  Abdominal: Soft. He exhibits no distension. There is no tenderness. There is no rebound and no guarding.  Musculoskeletal:       Right knee: He exhibits swelling. Tenderness found.       Right ankle: He exhibits swelling. Tenderness.       Left ankle: He exhibits swelling. Tenderness.       Right lower leg: He exhibits tenderness, swelling and edema.       Left lower leg: He exhibits tenderness, swelling and edema.       Right foot: There is tenderness and swelling.       Left foot: There is tenderness and swelling.  Left foot ecchymoses and purpura. Bilateral significant leg swelling present, tense, 2+ pitting edema.  Neurological: He is alert.  Skin: Skin is warm and dry. Capillary refill takes less than 2 seconds. He is not diaphoretic.  Yellow hue to skin  Psychiatric: He has a normal mood and affect.  Nursing note and vitals reviewed.  ED Treatments / Results  Labs (all labs ordered are listed, but only abnormal results are displayed) Labs Reviewed  CBC - Abnormal; Notable for the following:       Result Value   RBC 3.28 (*)    Hemoglobin 8.2 (*)    HCT 27.1 (*)    MCH 25.0 (*)    RDW 17.0 (*)    Platelets 440 (*)    All other components within normal limits  COMPREHENSIVE METABOLIC PANEL - Abnormal; Notable for the following:    Sodium 129 (*)    Chloride 99 (*)    CO2 15 (*)    Glucose, Bld 115 (*)    BUN 25 (*)    Creatinine, Ser 1.60 (*)    Calcium 8.2 (*)    Total Protein 6.4 (*)    Albumin 3.0 (*)    Total Bilirubin 2.9 (*)    GFR calc non Af Amer 45 (*)    GFR calc Af Amer 52 (*)    All other components within normal limits  URINALYSIS,  ROUTINE W REFLEX MICROSCOPIC - Abnormal; Notable for the following:    Color, Urine AMBER (*)    Glucose, UA 50 (*)    Hgb urine dipstick SMALL (*)    Ketones, ur 20 (*)    Bacteria, UA RARE (*)    All other components within normal limits  PROTIME-INR - Abnormal; Notable for the following:    Prothrombin Time 15.8 (*)    All other components within normal limits  I-STAT CG4 LACTIC ACID, ED - Abnormal; Notable for the following:    Lactic Acid, Venous 4.24 (*)    All other  components within normal limits  I-STAT CHEM 8, ED - Abnormal; Notable for the following:    Sodium 131 (*)    BUN 23 (*)    Glucose, Bld 114 (*)    Calcium, Ion 1.05 (*)    TCO2 15 (*)    Hemoglobin 9.2 (*)    HCT 27.0 (*)    All other components within normal limits  I-STAT VENOUS BLOOD GAS, ED - Abnormal; Notable for the following:    pCO2, Ven 26.9 (*)    pO2, Ven 62.0 (*)    Bicarbonate 13.6 (*)    TCO2 14 (*)    Acid-base deficit 11.0 (*)    All other components within normal limits  I-STAT CG4 LACTIC ACID, ED - Abnormal; Notable for the following:    Lactic Acid, Venous 2.65 (*)    All other components within normal limits  I-STAT VENOUS BLOOD GAS, ED - Abnormal; Notable for the following:    pCO2, Ven 25.8 (*)    pO2, Ven 21.0 (*)    Bicarbonate 15.7 (*)    TCO2 16 (*)    Acid-base deficit 8.0 (*)    All other components within normal limits  CULTURE, BLOOD (ROUTINE X 2)  CULTURE, BLOOD (ROUTINE X 2)  BRAIN NATRIURETIC PEPTIDE  AMMONIA  LIPASE, BLOOD  GAMMA GT  HEPATITIS PANEL, ACUTE  CBG MONITORING, ED  I-STAT TROPONIN, ED    EKG  EKG Interpretation  Date/Time:  Saturday September 12 2016 18:30:12 EDT Ventricular Rate:  101 PR Interval:    QRS Duration: 94 QT Interval:  334 QTC Calculation: 433 R Axis:   48 Text Interpretation:  Atrial fibrillation No previous tracing Confirmed by Lajean Saver [phone]) on 09/12/2016 7:32:05 PM       Radiology Ct Abdomen Pelvis W  Contrast  Result Date: 09/12/2016 CLINICAL DATA:  Swelling in the legs weakness with hard bowel movement EXAM: CT ABDOMEN AND PELVIS WITH CONTRAST TECHNIQUE: Multidetector CT imaging of the abdomen and pelvis was performed using the standard protocol following bolus administration of intravenous contrast. CONTRAST:  69m ISOVUE-300 IOPAMIDOL (ISOVUE-300) INJECTION 61% COMPARISON:  None. FINDINGS: Lower chest: No acute abnormality. Hepatobiliary: No calcified gallstones or biliary dilatation. Scattered subcentimeter liver hypodensities too small to further characterize Pancreas: Unremarkable. No pancreatic ductal dilatation or surrounding inflammatory changes. Spleen: Normal in size without focal abnormality. Adrenals/Urinary Tract: Adrenal glands are within normal limits. Multiple hypodensities within the region of renal collecting systems bilaterally, these do not appear to fill in with contrast on delayed images and are suggestive of parapelvic cysts. Negative for hydroureter or ureteral stone. The bladder is normal Stomach/Bowel: Stomach is within normal limits. Appendix not visualized consistent with history of appendectomy. Sigmoid colon diverticula without acute inflammation No evidence of bowel wall thickening, distention, or inflammatory changes. Vascular/Lymphatic: No significant vascular findings are present. No enlarged abdominal or pelvic lymph nodes. Reproductive: Enlarged prostate with mass effect on the bladder. Heterogeneous in appearance Other: Negative for free air or free fluid Musculoskeletal: Moderate compression of L1.  Degenerative changes. IMPRESSION: 1. Negative for bowel obstruction or acute intra-abdominal or pelvic pathology. Sigmoid colon diverticular disease without acute inflammation 2. Cystic density in the region of renal collecting systems bilaterally without filling on delayed images suggesting parapelvic cysts. Negative for ureteral dilatation or ureteral stone 3. Enlarged  heterogeneous prostate with mass effect on the bladder. Clinical correlation recommended 4. Moderate compression of L1 Electronically Signed   By: KDonavan FoilM.D.   On: 09/12/2016  21:29   Dg Chest Portable 1 View  Result Date: 09/12/2016 CLINICAL DATA:  Shortness breath on exertion for 2 weeks EXAM: PORTABLE CHEST 1 VIEW COMPARISON:  None. FINDINGS: The heart size and mediastinal contours are within normal limits. Both lungs are clear. The visualized skeletal structures are unremarkable. IMPRESSION: No active cardiopulmonary disease. Electronically Signed   By: Abelardo Diesel M.D.   On: 09/12/2016 18:50    Procedures Procedures (including critical care time)  Medications Ordered in ED Medications  lactated ringers bolus 1,000 mL (1,000 mLs Intravenous New Bag/Given 09/12/16 2240)  cefTRIAXone (ROCEPHIN) 1 g in dextrose 5 % 50 mL IVPB (0 g Intravenous Stopped 09/12/16 2222)  sodium chloride 0.9 % bolus 1,000 mL (0 mLs Intravenous Stopped 09/12/16 2223)  iopamidol (ISOVUE-300) 61 % injection (80 mLs  Contrast Given 09/12/16 2105)     Initial Impression / Assessment and Plan / ED Course  I have reviewed the triage vital signs and the nursing notes.  Pertinent labs & imaging results that were available during my care of the patient were reviewed by me and considered in my medical decision making (see chart for details).     This is a 61 year old male who denies any PMH except for BPH who presents from urgent care due to neighbor's concern for well-being. From urgent care patient was apparently hypotensive and short of breath.  Patient was quickly weaned off nasal cannula and tolerated room air.  His initial blood pressure was 740'C systolic.  Patient had yellow hue to his skin although he subjectively denied any change in skin color. Bilateral legs have 2+ pitting tense edema, ecchymosis and purpura about the left medial ankle.  Ultrasound performed; negative for DVT. Broad laboratory studies  performed as above.  CMP showing hyponatremia along with hypoalbuminemia, bilirubin elevated at 3, creatinine 1.6, unknown baseline. Ammonia negative, GGT unremarkable, lipase unremarkable.  INR and PT are unremarkable.   Hepatitis panel ordered. No leukocytosis, hemoglobin 8.2. Initial lactate 4.2.  Patient given 2 L rehydration and given his concerning presentation he was also given Rocephin as he met SIRS criteria. Blood cultures drawn prior to ABX.  Patient denies daily alcohol use.  He states he last drank in the 1980s.  CT abdomen pelvis with contrast performed and was negative for any intra-abdominal masses.  There was evidence of enlarged heterogenous prostate with mass-effect on the bladder.  Given patient's multiple laboratory abnormalities, concerning heterogenous mass in the prostate, self negative limits at home and concern for health well-being, consulted hospitalist for admission.  Reviewed plan of care at bedside with patient and family members who agreed.  All questions answered.  Final Clinical Impressions(s) / ED Diagnoses   Final diagnoses:  Leg swelling  Dehydration  Elevated bilirubin   New Prescriptions New Prescriptions   No medications on file     Aldona Lento, MD 09/12/16 1448    Lajean Saver, MD 09/12/16 2312

## 2016-09-12 NOTE — ED Notes (Signed)
vasc tech paged

## 2016-09-12 NOTE — ED Notes (Signed)
The pt is  Alert no pain friend at the bedside

## 2016-09-12 NOTE — ED Notes (Signed)
Dopplers of both legs completed

## 2016-09-12 NOTE — ED Provider Notes (Signed)
[facility]    CSN: [id number] Arrival date & time: 09/12/16  1638     History   Chief Complaint Chief Complaint  Patient presents with  . Shortness of Breath  . Weakness    HPI Jason Humphrey is a 61 y.o. male.   With reported history of HLD, diverticulosis, BPH, hx of Chest pain, brought in by a neighbor today in wheelchair to be evaluated. The last time patient saw a doctor was 10 years ago. Neighbor states that patient was fine 1 month ago and then he "now looks like this". Patient reports SOB progressively worsening for the past month, dizziness and bilateral leg swelling. He denies chest pain.   Initial BP was 85/65, unable to obtain an O2Sat on patient. HR 135.       Past Medical History:  Diagnosis Date  . Benign prostatic hypertrophy   . Diverticulosis of colon   . History of chest pain 05/2005   Nuclear stress test neg for ischemia (?mild LVH, ? low normal EF)  . Hyperlipidemia     Patient Active Problem List   Diagnosis Date Noted  . Bilateral lower extremity edema 09/13/2016  . Anemia 09/13/2016  . General weakness 09/13/2016  . Hypotension 09/12/2016  . DIVERTICULOSIS, COLON 03/26/2008  . HYPERLIPIDEMIA 03/18/2007  . BPH (benign prostatic hyperplasia) 03/18/2007  . CHEST PAIN, ATYPICAL, HX OF 03/17/2007    Past Surgical History:  Procedure Laterality Date  . APPENDECTOMY    . COLONOSCOPY  12/2003   Diverticulosis, Dr.George Virginia [place]   . TONSILLECTOMY         Home Medications    Prior to Admission medications   Medication Sig Start Date End Date Taking? Authorizing Provider  meclizine (ANTIVERT) 25 MG tablet Take 1 or 2 po Q 6hrs for dizziness Patient not taking: Reported on 09/12/2016 01/03/13   Devoria Albe, MD  promethazine (PHENERGAN) 25 MG tablet Take 1 tablet (25 mg total) by mouth every 8 (eight) hours as needed for nausea or vomiting. Patient not taking: Reported on 09/12/2016 01/03/13   Devoria Albe, MD    Family History Family  History  Problem Relation Age of Onset  . Parkinsonism Father   . Neuropathy Mother   . Hypertension Mother   . Alcohol abuse Paternal Grandfather   . Hypertension Paternal Uncle     Social History Social History  Substance Use Topics  . Smoking status: Former Smoker    Quit date: 01/13/1988  . Smokeless tobacco: Never Used  . Alcohol use No     Allergies   Nabumetone   Review of Systems Review of Systems  Constitutional:       See HPI     Physical Exam Triage Vital Signs ED Triage Vitals [09/12/16 1756]  Enc Vitals Group     BP (!) 85/65     Pulse Rate (!) 135     Resp (!) 22     Temp      Temp src      SpO2 100 %     Weight      Height      Head Circumference      Peak Flow      Pain Score      Pain Loc      Pain Edu?      Excl. in GC?    No data found.   Updated Vital Signs BP 91/64   Pulse (!) 118   Resp 18   SpO2  100%   Visual Acuity Right Eye Distance:   Left Eye Distance:   Bilateral Distance:    Right Eye Near:   Left Eye Near:    Bilateral Near:     Physical Exam  Constitutional:  Ill-appearing, frail, in wheelchair with hunched over posture.   HENT:  Head: Normocephalic and atraumatic.  Cardiovascular:  S1 S2, tachycardiac, pale Edema noted in bilateral leg, has ecchymosis/purpura on left lower leg.  No murmur noted  Pulmonary/Chest: Effort normal and breath sounds normal. He has no wheezes.  Abdominal: Soft. Bowel sounds are normal.  Neurological: He is alert.  Seems to be alert  Skin:  Pale, slightly jaundice appearing  Psychiatric: He has a normal mood and affect.  Vitals reviewed.    UC Treatments / Results  Labs (all labs ordered are listed, but only abnormal results are displayed) Labs Reviewed  CBC WITH DIFFERENTIAL/PLATELET - Abnormal; Notable for the following:       Result Value   RBC 3.37 (*)    Hemoglobin 8.4 (*)    HCT 27.7 (*)    MCH 24.9 (*)    RDW 17.2 (*)    Platelets 553 (*)    All other  components within normal limits  COMPREHENSIVE METABOLIC PANEL - Abnormal; Notable for the following:    Sodium 130 (*)    Chloride 99 (*)    CO2 14 (*)    Glucose, Bld 135 (*)    BUN 23 (*)    Creatinine, Ser 1.51 (*)    Calcium 8.5 (*)    Albumin 3.0 (*)    AST 50 (*)    Total Bilirubin 3.3 (*)    GFR calc non Af Amer 48 (*)    GFR calc Af Amer 55 (*)    Anion gap 17 (*)    All other components within normal limits  D-DIMER, QUANTITATIVE (NOT AT [facility]) - Abnormal; Notable for the following:    D-Dimer, Quant 1.42 (*)    All other components within normal limits    EKG  EKG Interpretation None       Radiology No results found.  Procedures Procedures (including critical care time)  Medications Ordered in UC Medications - No data to display   Initial Impression / Assessment and Plan / UC Course  I have reviewed the triage vital signs and the nursing notes.  Pertinent labs & imaging results that were available during my care of the patient were reviewed by me and considered in my medical decision making (see chart for details).     Final Clinical Impressions(s) / UC Diagnoses   Final diagnoses:  Hypotension, unspecified hypotension type  Shortness of breath   Triage nurse was concerned during triaging, came and grab this provider for immediate evaluation. VS were critical initially.   Given patient's concerning appearance, priorities was to stabilize the patient and transport patient to the ER as soon as possible. IV was established, NS infusing, Blood obtained and CBC, CMP, D-dimer ordered to facilitate the ER staff.   Was not able to obtain O2Sat, however patient was complaining of shortness of breath, therefore oxygen was given while waiting for [facility]  [facility] called and arrived to bedside and transported patient to the ER.   Clinical nurse gave report to the [facility] and the triage nurse at the ER.   New Prescriptions Discharge Medication List as of  09/12/2016  6:08 PM       Controlled Substance Prescriptions [place] Controlled Substance Registry  consulted? No   Lucia EstelleZheng, Anandi Abramo, NP 09/15/16 [phone]

## 2016-09-12 NOTE — H&P (Signed)
History and Physical  Jason Humphrey WUJ:[id number] DOB: 1955/12/03 DOA: 09/12/2016  PCP:  Pecola Lawless, MD   Chief Complaint:  Dyspnea on exertion   History of Present Illness:  Pt is a 61 yo male with hx of BPh who was brought with cc of dyspnea on exertion and leg swelling for the past month that has gotten worse progressively. He went to UC today and was found to be hypotensive at 85 systolic so he was sent to the ED. Pt said he has had dyspnea on exertion for a month w/o chest pain , wheezing, cough, fever, chills. He also denied PND or orthopnea. He denied hx of MI/CVA/HTN. He denied any other complaints but in ROS reported legs to be swollen and yellow. He has had fatigue and tiredness but no weight loss. He sometimes feels his heart rate is racing especially when walking.   Review of Systems:  CONSTITUTIONAL:     No night sweats.  +fatigue.  No fever. No chills. Eyes:                            No visual changes.  No eye pain.  No eye discharge.   ENT:                              No epistaxis.  No sinus pain.  No sore throat.   No congestion. RESPIRATORY:           No cough.  No wheeze.  No hemoptysis.  +dyspnea CARDIOVASCULAR   :  No chest pains.  No palpitations. GASTROINTESTINAL:  No abdominal pain.  No nausea. No vomiting.  No diarrhea. No           constipation.  No hematemesis.  No hematochezia.  No melena. GENITOURINARY:      No urgency.  No frequency.  No dysuria.  No hematuria.  No   obstructive symptoms.  No discharge.  No pain.   MUSCULOSKELETAL:  No musculoskeletal pain.  No joint swelling.  No arthritis. NEUROLOGICAL:        No confusion.  +weakness. No headache. No seizure. PSYCHIATRIC:             No depression. No anxiety. No suicidal ideation. SKIN:                             No rashes.  No lesions.  No wounds. ENDOCRINE:                No weight loss.  No polydipsia.  No polyuria.  No polyphagia. HEMATOLOGIC:           No purpura.  +petechiae.  No  bleeding.  ALLERGIC                 : No pruritus.  No angioedema Other:  Past Medical and Surgical History:   Past Medical History:  Diagnosis Date  . Benign prostatic hypertrophy   . Diverticulosis of colon   . History of chest pain 05/2005   Nuclear stress test neg for ischemia (?mild LVH, ? low normal EF)  . Hyperlipidemia    Past Surgical History:  Procedure Laterality Date  . APPENDECTOMY    . COLONOSCOPY  12/2003   Diverticulosis, Dr.George Virginia [place]   . TONSILLECTOMY      Social History:  reports that he quit smoking about 28 years ago. He has never used smokeless tobacco. He reports that he does not drink alcohol or use drugs.   Allergies  Allergen Reactions  . Nabumetone Palpitations and Other (See Comments)    Tachycardia     Family History  Problem Relation Age of Onset  . Parkinsonism Father   . Neuropathy Mother   . Hypertension Mother   . Alcohol abuse Paternal Grandfather   . Hypertension Paternal Uncle       Prior to Admission medications   Medication Sig Start Date End Date Taking? Authorizing Provider  meclizine (ANTIVERT) 25 MG tablet Take 1 or 2 po Q 6hrs for dizziness Patient not taking: Reported on 09/12/2016 01/03/13   Devoria Albe, MD  promethazine (PHENERGAN) 25 MG tablet Take 1 tablet (25 mg total) by mouth every 8 (eight) hours as needed for nausea or vomiting. Patient not taking: Reported on 09/12/2016 01/03/13   Devoria Albe, MD    Physical Exam: BP 131/76   Pulse 89   Temp 97.9 F (36.6 C) (Oral)   Resp 16   SpO2 100%   GENERAL :   Alert and cooperative, and appears to be in no acute distress. HEAD:           normocephalic. EYES:            PERRL, EOMI.   EARS:           hearing grossly intact. NOSE:           No nasal discharge.  NECK:          supple, non-tender.  CARDIAC:    Normal S1 and S2. No gallop. No murmurs.   LUNGS:       Clear to auscultation  ABDOMEN: Positive bowel sounds. Soft, nondistended, nontender. No guarding  or rebound.       EXT           : lower legs are yellow below knee level with several bruises and questionable petechiae. No pitting edema but skin feels hard and can be painful to sequeze muscles with cold feet.  Neuro        : Alert, oriented to person, place, and time.                      CN II-XII intact.            SKIN:            No rash. No lesions. PSYCH:       No hallucination. Patient is not suicidal.          Labs on Admission:  Reviewed.   Radiological Exams on Admission: Ct Abdomen Pelvis W Contrast  Result Date: 09/12/2016 CLINICAL DATA:  Swelling in the legs weakness with hard bowel movement EXAM: CT ABDOMEN AND PELVIS WITH CONTRAST TECHNIQUE: Multidetector CT imaging of the abdomen and pelvis was performed using the standard protocol following bolus administration of intravenous contrast. CONTRAST:  80mL ISOVUE-300 IOPAMIDOL (ISOVUE-300) INJECTION 61% COMPARISON:  None. FINDINGS: Lower chest: No acute abnormality. Hepatobiliary: No calcified gallstones or biliary dilatation. Scattered subcentimeter liver hypodensities too small to further characterize Pancreas: Unremarkable. No pancreatic ductal dilatation or surrounding inflammatory changes. Spleen: Normal in size without focal abnormality. Adrenals/Urinary Tract: Adrenal glands are within normal limits. Multiple hypodensities within the region of renal collecting systems bilaterally, these do not appear to fill in with contrast on delayed images and are suggestive of  parapelvic cysts. Negative for hydroureter or ureteral stone. The bladder is normal Stomach/Bowel: Stomach is within normal limits. Appendix not visualized consistent with history of appendectomy. Sigmoid colon diverticula without acute inflammation No evidence of bowel wall thickening, distention, or inflammatory changes. Vascular/Lymphatic: No significant vascular findings are present. No enlarged abdominal or pelvic lymph nodes. Reproductive: Enlarged prostate with  mass effect on the bladder. Heterogeneous in appearance Other: Negative for free air or free fluid Musculoskeletal: Moderate compression of L1.  Degenerative changes. IMPRESSION: 1. Negative for bowel obstruction or acute intra-abdominal or pelvic pathology. Sigmoid colon diverticular disease without acute inflammation 2. Cystic density in the region of renal collecting systems bilaterally without filling on delayed images suggesting parapelvic cysts. Negative for ureteral dilatation or ureteral stone 3. Enlarged heterogeneous prostate with mass effect on the bladder. Clinical correlation recommended 4. Moderate compression of L1 Electronically Signed   By: Jasmine PangKim  Fujinaga M.D.   On: 09/12/2016 21:29   Dg Chest Portable 1 View  Result Date: 09/12/2016 CLINICAL DATA:  Shortness breath on exertion for 2 weeks EXAM: PORTABLE CHEST 1 VIEW COMPARISON:  None. FINDINGS: The heart size and mediastinal contours are within normal limits. Both lungs are clear. The visualized skeletal structures are unremarkable. IMPRESSION: No active cardiopulmonary disease. Electronically Signed   By: Sherian ReinWei-Chen  Lin M.D.   On: 09/12/2016 18:50       Assessment/Plan   Hypotension:  BP at baseline:unknown  BP today: 80s systolic, improved to 110s after 1-2 L of NS DDx:  Sepsis : less likely with clear CXR and UA and no symptoms to suggest infection. Got Ceftriaxone in the ED. bcx sent, will monitor. Endo: adrenal crisis : less likely with no sustained hypotension but will check in am if continues to be.  Cardio: MI, arrythemia, valvular disease, tamponade/pericardial disease : all in ddx, will check Echo. Initial trop and EKG non remarkable. Keep on Tele in stepdown unit. Hypovolemia: bleeding. I have hemolytic anemia in ddx esp with lower legs findings in exam. Will check LDH/haptoglobin/DB Pulmonary: PE : less likely with low probability for PE. Will check Echo for right heart strain ? LE duplex was neg for DVT.   Elevated  LA: although worrisome for sepsis, hx/PE do not suggest sepsis esp w/o fever/leukocytosis. Maybe due to hemolytic anemia or hypotension. Improved with IVF. Will monitor  Anemia: normocytic, possibly hemolytic will check haptoglobin and LDH. Will check iron profile .  CKD vs AKI: CT abdomen unremarkable for obstruction. Possible intra/pre renal injury. Will repeat in am as pt had IVF.   Dyspnea on exertion: ddx include anemia vs heart failure. Continue oxygen therapy for now.   Input & Output: ordered   Lines & Tubes: PIV DVT prophylaxis: [place] hep GI prophylaxis: nA Consultants: NA Code Status: full Family Communication: at bedside Disposition Plan: TBD    Eston EstersAhmad Shamel Germond M.D [facility]

## 2016-09-12 NOTE — Progress Notes (Signed)
VASCULAR LAB PRELIMINARY  PRELIMINARY  PRELIMINARY  PRELIMINARY  Bilateral lower extremity venous duplex completed.    Preliminary report:  There is no DVT or SVT noted in the bilateral lower extremities.  Interstitial fluid noted throughout.  Incidentally, arterial flow appears adequate throughout the bilateral lower extremities.   Gave report to Dr. Denton LankSteinl.  Jason Humphrey, RVT 09/12/2016, 7:35 PM

## 2016-09-12 NOTE — ED Notes (Signed)
Pt getting ready toi go to c-t  As soon as his blood is drawn

## 2016-09-12 NOTE — ED Notes (Signed)
Pt has bil lower leg with significant edema with contusions noted, pt has doppler pulses

## 2016-09-12 NOTE — ED Triage Notes (Signed)
Pt here for weakness and SOB worsening over the past month.

## 2016-09-12 NOTE — ED Notes (Signed)
Shanda BumpsJessica, ED Charge RN and Trey PaulaJeff [facility] given report.

## 2016-09-13 ENCOUNTER — Inpatient Hospital Stay (HOSPITAL_COMMUNITY): Payer: Self-pay

## 2016-09-13 ENCOUNTER — Encounter (HOSPITAL_COMMUNITY): Payer: Self-pay

## 2016-09-13 DIAGNOSIS — R600 Localized edema: Secondary | ICD-10-CM | POA: Diagnosis present

## 2016-09-13 DIAGNOSIS — M7989 Other specified soft tissue disorders: Secondary | ICD-10-CM

## 2016-09-13 DIAGNOSIS — N400 Enlarged prostate without lower urinary tract symptoms: Secondary | ICD-10-CM

## 2016-09-13 DIAGNOSIS — R0600 Dyspnea, unspecified: Secondary | ICD-10-CM

## 2016-09-13 DIAGNOSIS — E860 Dehydration: Secondary | ICD-10-CM

## 2016-09-13 DIAGNOSIS — D649 Anemia, unspecified: Secondary | ICD-10-CM | POA: Diagnosis present

## 2016-09-13 DIAGNOSIS — R531 Weakness: Secondary | ICD-10-CM

## 2016-09-13 DIAGNOSIS — R17 Unspecified jaundice: Secondary | ICD-10-CM

## 2016-09-13 LAB — IRON AND TIBC
Iron: 23 ug/dL — ABNORMAL LOW (ref 45–182)
Saturation Ratios: 8 % — ABNORMAL LOW (ref 17.9–39.5)
TIBC: 298 ug/dL (ref 250–450)
UIBC: 275 ug/dL

## 2016-09-13 LAB — CBC
HCT: 29.3 % — ABNORMAL LOW (ref 39.0–52.0)
HEMATOCRIT: 22.5 % — AB (ref 39.0–52.0)
HEMOGLOBIN: 6.8 g/dL — AB (ref 13.0–17.0)
Hemoglobin: 9.2 g/dL — ABNORMAL LOW (ref 13.0–17.0)
MCH: 24.8 pg — AB (ref 26.0–34.0)
MCH: 26.4 pg (ref 26.0–34.0)
MCHC: 30.2 g/dL (ref 30.0–36.0)
MCHC: 31.4 g/dL (ref 30.0–36.0)
MCV: 82.1 fL (ref 78.0–100.0)
MCV: 84 fL (ref 78.0–100.0)
PLATELETS: 352 10*3/uL (ref 150–400)
Platelets: 340 10*3/uL (ref 150–400)
RBC: 2.74 MIL/uL — ABNORMAL LOW (ref 4.22–5.81)
RBC: 3.49 MIL/uL — ABNORMAL LOW (ref 4.22–5.81)
RDW: 16.5 % — AB (ref 11.5–15.5)
RDW: 17.3 % — ABNORMAL HIGH (ref 11.5–15.5)
WBC: 4.8 10*3/uL (ref 4.0–10.5)
WBC: 6.6 10*3/uL (ref 4.0–10.5)

## 2016-09-13 LAB — ECHOCARDIOGRAM COMPLETE
CHL CUP DOP CALC LVOT VTI: 17.9 cm
E decel time: 229 msec
E/e' ratio: 4.77
FS: 37 % (ref 28–44)
IV/PV OW: 0.93
LA ID, A-P, ES: 39 mm
LA diam end sys: 39 mm
LA diam index: 1.93 cm/m2
LA vol A4C: 42.1 ml
LA vol index: 21.6 mL/m2
LA vol: 43.6 mL
LV PW d: 11 mm — AB (ref 0.6–1.1)
LV TDI E'MEDIAL: 7.83
LV e' LATERAL: 12 cm/s
LVEEAVG: 4.77
LVEEMED: 4.77
LVOT area: 4.52 cm2
LVOT diameter: 24 mm
LVOT peak vel: 88.7 cm/s
LVOTSV: 81 mL
Lateral S' vel: 16.8 cm/s
MV Dec: 229
MV pk E vel: 57.2 m/s
MVPKAVEL: 83.4 m/s
RV TAPSE: 22.3 mm
TDI e' lateral: 12

## 2016-09-13 LAB — TSH: TSH: 1.662 u[IU]/mL (ref 0.350–4.500)

## 2016-09-13 LAB — COMPREHENSIVE METABOLIC PANEL
ALBUMIN: 2.6 g/dL — AB (ref 3.5–5.0)
ALT: 16 U/L — ABNORMAL LOW (ref 17–63)
ANION GAP: 13 (ref 5–15)
AST: 21 U/L (ref 15–41)
Alkaline Phosphatase: 61 U/L (ref 38–126)
BUN: 23 mg/dL — ABNORMAL HIGH (ref 6–20)
CHLORIDE: 101 mmol/L (ref 101–111)
CO2: 14 mmol/L — AB (ref 22–32)
Calcium: 7.6 mg/dL — ABNORMAL LOW (ref 8.9–10.3)
Creatinine, Ser: 1.3 mg/dL — ABNORMAL HIGH (ref 0.61–1.24)
GFR calc Af Amer: >60 mL/min (ref >60)
GFR calc non Af Amer: 57 mL/min — ABNORMAL LOW (ref >60)
GLUCOSE: 98 mg/dL (ref 65–99)
POTASSIUM: 4.1 mmol/L (ref 3.5–5.1)
SODIUM: 128 mmol/L — AB (ref 135–145)
TOTAL PROTEIN: 5.5 g/dL — AB (ref 6.5–8.1)
Total Bilirubin: 2.3 mg/dL — ABNORMAL HIGH (ref 0.3–1.2)

## 2016-09-13 LAB — RETICULOCYTES
RBC.: 2.7 MIL/uL — AB (ref 4.22–5.81)
Retic Count, Absolute: 183.6 10*3/uL (ref 19.0–186.0)
Retic Ct Pct: 6.8 % — ABNORMAL HIGH (ref 0.4–3.1)

## 2016-09-13 LAB — HIV ANTIBODY (ROUTINE TESTING W REFLEX): HIV Screen 4th Generation wRfx: NONREACTIVE

## 2016-09-13 LAB — FERRITIN: Ferritin: 75 ng/mL (ref 24–336)

## 2016-09-13 LAB — D-DIMER, QUANTITATIVE: D-Dimer, Quant: 1.2 ug/mL-FEU — ABNORMAL HIGH (ref 0.00–0.50)

## 2016-09-13 LAB — VITAMIN B12: Vitamin B-12: 197 pg/mL (ref 180–914)

## 2016-09-13 LAB — FIBRINOGEN: Fibrinogen: 480 mg/dL — ABNORMAL HIGH (ref 210–475)

## 2016-09-13 LAB — PREPARE RBC (CROSSMATCH)

## 2016-09-13 LAB — SEDIMENTATION RATE: SED RATE: 49 mm/h — AB (ref 0–16)

## 2016-09-13 LAB — ABO/RH: ABO/RH(D): O POS

## 2016-09-13 LAB — DIRECT ANTIGLOBULIN TEST (NOT AT ARMC)
DAT, COMPLEMENT: NEGATIVE
DAT, IGG: NEGATIVE

## 2016-09-13 LAB — FOLATE: Folate: 3.8 ng/mL — ABNORMAL LOW (ref >5.9)

## 2016-09-13 LAB — HEMOGLOBIN A1C
HEMOGLOBIN A1C: 4.1 % — AB (ref 4.8–5.6)
MEAN PLASMA GLUCOSE: 70.97 mg/dL

## 2016-09-13 LAB — LACTATE DEHYDROGENASE: LDH: 130 U/L (ref 98–192)

## 2016-09-13 LAB — SAVE SMEAR

## 2016-09-13 MED ORDER — SODIUM CHLORIDE 0.9 % IV SOLN
INTRAVENOUS | Status: DC
  Administered 2016-09-13 – 2016-09-14 (×2): via INTRAVENOUS

## 2016-09-13 MED ORDER — ONDANSETRON HCL 4 MG PO TABS
4.0000 mg | ORAL_TABLET | Freq: Four times a day (QID) | ORAL | Status: DC | PRN
  Administered 2016-09-13 – 2016-09-14 (×2): 4 mg via ORAL
  Filled 2016-09-13 (×2): qty 1

## 2016-09-13 MED ORDER — ENSURE ENLIVE PO LIQD
237.0000 mL | Freq: Two times a day (BID) | ORAL | Status: DC
  Administered 2016-09-13 – 2016-09-16 (×4): 237 mL via ORAL

## 2016-09-13 MED ORDER — SODIUM CHLORIDE 0.9 % IV SOLN
Freq: Once | INTRAVENOUS | Status: AC
  Administered 2016-09-13: 09:00:00 via INTRAVENOUS

## 2016-09-13 MED ORDER — FUROSEMIDE 10 MG/ML IJ SOLN
40.0000 mg | Freq: Once | INTRAMUSCULAR | Status: AC
  Administered 2016-09-13: 40 mg via INTRAVENOUS
  Filled 2016-09-13: qty 4

## 2016-09-13 MED ORDER — HYDROCODONE-ACETAMINOPHEN 5-325 MG PO TABS
1.0000 | ORAL_TABLET | Freq: Four times a day (QID) | ORAL | Status: DC | PRN
  Administered 2016-09-13 – 2016-09-14 (×2): 1 via ORAL
  Filled 2016-09-13 (×3): qty 1

## 2016-09-13 MED ORDER — ONDANSETRON HCL 4 MG PO TABS
4.0000 mg | ORAL_TABLET | Freq: Once | ORAL | Status: DC
Start: 2016-09-13 — End: 2016-09-13

## 2016-09-13 MED ORDER — ONDANSETRON 4 MG PO TBDP
ORAL_TABLET | ORAL | Status: AC
  Filled 2016-09-13: qty 1

## 2016-09-13 NOTE — ED Notes (Signed)
Admitting paged regarding urine leaking around foley catheter.

## 2016-09-13 NOTE — ED Notes (Signed)
To mri 

## 2016-09-13 NOTE — ED Notes (Signed)
Pt still in mri 

## 2016-09-13 NOTE — ED Notes (Signed)
Attending returned page, instructs to replace patient's current foley catheter with 16 french foley catheter.

## 2016-09-13 NOTE — ED Notes (Signed)
b lood has been started 15 minute check complete.  The pt reports that he feels,likemhe is not emptying his bladder  Voiding in increments

## 2016-09-13 NOTE — ED Notes (Signed)
Dr Aurora Maskhammad notified of pts voiding difficulty  Bladder scanned found 560ml in bladder.  He ordered foley catheter insertion

## 2016-09-13 NOTE — Progress Notes (Signed)
PROGRESS NOTE  Jason Humphrey  ZOX:[id number] DOB: 1955/10/13 DOA: 09/12/2016 PCP: Pecola LawlessHopper, William F, MD Outpatient Specialists:  Subjective: Patient is seen with his family at bedside, denies fever, denies chills, denies any chest pain. Has significant lower extremity swelling.  Brief Narrative:  Pt is a 61 yo male with hx of BPh who was brought with cc of dyspnea on exertion and leg swelling for the past month that has gotten worse progressively. He went to UC today and was found to be hypotensive at 85 systolic so he was sent to the ED. Pt said he has had dyspnea on exertion for a month w/o chest pain , wheezing, cough, fever, chills. He also denied PND or orthopnea. He denied hx of MI/CVA/HTN. He denied any other complaints but in ROS reported legs to be swollen and yellow. He has had fatigue and tiredness but no weight loss. He sometimes feels his heart rate is racing especially when walking.   Assessment & Plan:   Principal Problem:   General weakness Active Problems:   BPH (benign prostatic hyperplasia)   Hypotension   Bilateral lower extremity edema   Anemia   Anemia -Normocytic anemia patient presented to the hospital with hemoglobin of 6.8. -Check an anemia panel, this is a normocytic anemia. -Patient denies any bleeding or dark stools. -Transfuse 2 units of packed RBCs, check hemoglobin after transfusion.  Bilateral lower extremity swelling -Check 2-D echo, his BNP is not elevated, has albumin of 2.6. -This could be secondary to third spacing. -He also has lower sodium level. Elevate lower extremity  Hypernatremia -Sodium is 128, will start trickle IV fluid hydration and Lasix at same time. -Check BMP in a.m.  Generalized weakness -This is of multiple reasons, he has very poor nutritional condition, lower extremity edema and deconditioning. -PT OT to see.   DVT prophylaxis:  Code Status: Full Code Family Communication:  Disposition Plan:  Diet: Diet regular  Room service appropriate? Yes; Fluid consistency: Thin  Consultants:   None  Procedures:   None  Antimicrobials:   None   Objective: Vitals:   09/13/16 0900 09/13/16 1000 09/13/16 1030 09/13/16 1100  BP: 100/70 123/71 119/70 123/70  Pulse: (!) 114 (!) 107 (!) 113 (!) 109  Resp: 18 19 15 16   Temp: 98.7 F (37.1 C)     TempSrc: Oral     SpO2: 100% 100% 100% 100%    Intake/Output Summary (Last 24 hours) at 09/13/16 1120 Last data filed at 09/13/16 0807  Gross per 24 hour  Intake             3365 ml  Output             1400 ml  Net             1965 ml   There were no vitals filed for this visit.  Examination: General exam: Appears calm and comfortable  Respiratory system: Clear to auscultation. Respiratory effort normal. Cardiovascular system: S1 & S2 heard, RRR. No JVD, murmurs, rubs, gallops or clicks. No pedal edema. Gastrointestinal system: Abdomen is nondistended, soft and nontender. No organomegaly or masses felt. Normal bowel sounds heard. Central nervous system: Alert and oriented. No focal neurological deficits. Extremities: Symmetric 5 x 5 power. Skin: No rashes, lesions or ulcers Psychiatry: Judgement and insight appear normal. Mood & affect appropriate.   Data Reviewed: I have personally reviewed following labs and imaging studies  CBC:  Recent Labs Lab 09/12/16 1810 09/12/16 1845 09/12/16 1855 09/12/16  2350  WBC 7.5  --  7.2 4.8  NEUTROABS 6.2  --   --   --   HGB 8.4* 9.2* 8.2* 6.8*  HCT 27.7* 27.0* 27.1* 22.5*  MCV 82.2  --  82.6 82.1  PLT 553*  --  440* 340   Basic Metabolic Panel:  Recent Labs Lab 09/12/16 1810 09/12/16 1845 09/12/16 1855 09/12/16 2350  NA 130* 131* 129* 128*  K 4.4 4.1 4.4 4.1  CL 99* 101 99* 101  CO2 14*  --  15* 14*  GLUCOSE 135* 114* 115* 98  BUN 23* 23* 25* 23*  CREATININE 1.51* 1.20 1.60* 1.30*  CALCIUM 8.5*  --  8.2* 7.6*   GFR: CrCl cannot be calculated (Unknown ideal weight.). Liver Function  Tests:  Recent Labs Lab 09/12/16 1810 09/12/16 1855 09/12/16 2350  AST 50* 26 21  ALT 20 17 16*  ALKPHOS 78 71 61  BILITOT 3.3* 2.9* 2.3*  PROT 6.7 6.4* 5.5*  ALBUMIN 3.0* 3.0* 2.6*    Recent Labs Lab 09/12/16 2101  LIPASE 33    Recent Labs Lab 09/12/16 2101  AMMONIA 24   Coagulation Profile:  Recent Labs Lab 09/12/16 2101  INR 1.27   Cardiac Enzymes: No results for input(s): CKTOTAL, CKMB, CKMBINDEX, TROPONINI in the last 168 hours. BNP (last 3 results) No results for input(s): PROBNP in the last 8760 hours. HbA1C: No results for input(s): HGBA1C in the last 72 hours. CBG:  Recent Labs Lab 09/12/16 2148  GLUCAP 95   Lipid Profile: No results for input(s): CHOL, HDL, LDLCALC, TRIG, CHOLHDL, LDLDIRECT in the last 72 hours. Thyroid Function Tests:  Recent Labs  09/12/16 2350  TSH 1.662   Anemia Panel:  Recent Labs  09/12/16 2350 09/13/16 0001  FERRITIN 75  --   TIBC 298  --   IRON 23*  --   RETICCTPCT  --  6.8*   Urine analysis:    Component Value Date/Time   COLORURINE AMBER (A) 09/12/2016 2146   APPEARANCEUR CLEAR 09/12/2016 2146   LABSPEC 1.030 09/12/2016 2146   PHURINE 5.0 09/12/2016 2146   GLUCOSEU 50 (A) 09/12/2016 2146   HGBUR SMALL (A) 09/12/2016 2146   HGBUR negative 10/19/2007 0912   BILIRUBINUR NEGATIVE 09/12/2016 2146   KETONESUR 20 (A) 09/12/2016 2146   PROTEINUR NEGATIVE 09/12/2016 2146   UROBILINOGEN 0.2 10/19/2007 0912   NITRITE NEGATIVE 09/12/2016 2146   LEUKOCYTESUR NEGATIVE 09/12/2016 2146   Sepsis Labs: @LABRCNTIP (procalcitonin:4,lacticidven:4)  ) Recent Results (from the past 240 hour(s))  Blood culture (routine x 2)     Status: None (Preliminary result)   Collection Time: 09/12/16  6:55 PM  Result Value Ref Range Status   Specimen Description BLOOD LEFT ANTECUBITAL  Final   Special Requests   Final    BOTTLES DRAWN AEROBIC AND ANAEROBIC Blood Culture adequate volume   Culture NO GROWTH < 24 HOURS  Final    Report Status PENDING  Incomplete  Blood culture (routine x 2)     Status: None (Preliminary result)   Collection Time: 09/12/16  9:01 PM  Result Value Ref Range Status   Specimen Description BLOOD LEFT FOREARM  Final   Special Requests IN PEDIATRIC BOTTLE Blood Culture adequate volume  Final   Culture NO GROWTH < 24 HOURS  Final   Report Status PENDING  Incomplete     Invalid input(s): PROCALCITONIN, LACTICACIDVEN   Radiology Studies: Ct Abdomen Pelvis W Contrast  Result Date: 09/12/2016 CLINICAL DATA:  Swelling in the  legs weakness with hard bowel movement EXAM: CT ABDOMEN AND PELVIS WITH CONTRAST TECHNIQUE: Multidetector CT imaging of the abdomen and pelvis was performed using the standard protocol following bolus administration of intravenous contrast. CONTRAST:  80mL ISOVUE-300 IOPAMIDOL (ISOVUE-300) INJECTION 61% COMPARISON:  None. FINDINGS: Lower chest: No acute abnormality. Hepatobiliary: No calcified gallstones or biliary dilatation. Scattered subcentimeter liver hypodensities too small to further characterize Pancreas: Unremarkable. No pancreatic ductal dilatation or surrounding inflammatory changes. Spleen: Normal in size without focal abnormality. Adrenals/Urinary Tract: Adrenal glands are within normal limits. Multiple hypodensities within the region of renal collecting systems bilaterally, these do not appear to fill in with contrast on delayed images and are suggestive of parapelvic cysts. Negative for hydroureter or ureteral stone. The bladder is normal Stomach/Bowel: Stomach is within normal limits. Appendix not visualized consistent with history of appendectomy. Sigmoid colon diverticula without acute inflammation No evidence of bowel wall thickening, distention, or inflammatory changes. Vascular/Lymphatic: No significant vascular findings are present. No enlarged abdominal or pelvic lymph nodes. Reproductive: Enlarged prostate with mass effect on the bladder. Heterogeneous in  appearance Other: Negative for free air or free fluid Musculoskeletal: Moderate compression of L1.  Degenerative changes. IMPRESSION: 1. Negative for bowel obstruction or acute intra-abdominal or pelvic pathology. Sigmoid colon diverticular disease without acute inflammation 2. Cystic density in the region of renal collecting systems bilaterally without filling on delayed images suggesting parapelvic cysts. Negative for ureteral dilatation or ureteral stone 3. Enlarged heterogeneous prostate with mass effect on the bladder. Clinical correlation recommended 4. Moderate compression of L1 Electronically Signed   By: Jasmine Pang M.D.   On: 09/12/2016 21:29   Mr Maxine Glenn Lower Extremity Bilateral Wo Contrast  Result Date: 09/13/2016 CLINICAL DATA:  Pain and swelling of the extremity EXAM: MR MRA OF THE BILATERAL LOWER EXTREMITY WITHOUT CONTRAST TECHNIQUE: Angiographic images of the extremity were obtained using MRA technique without intravenous contrast. COMPARISON:  CT 09/12/2016 FINDINGS: There is artifact at the level of the knees. There is excessive motion artifact which significantly limits diagnostic quality of the images. Lower extremity arterial system is only segmentally visualized due to the excessive motion and artifact. No obvious marrow edema within the imaged osseous structures. There are bilateral knee effusions. Subcutaneous and soft tissue edema within the bilateral thighs and lower legs, though poorly evaluated due to single plane of imaging and significant artifacts as described above. IMPRESSION: Very limited study. Significant motion and artifact degradation allows for only segmental visualization of the lower extremity vasculature.Bilateral knee effusions, small. Subcutaneous and soft tissue edema within the bilateral thighs and lower legs. Electronically Signed   By: Jasmine Pang M.D.   On: 09/13/2016 02:23   Dg Chest Portable 1 View  Result Date: 09/12/2016 CLINICAL DATA:  Shortness breath on  exertion for 2 weeks EXAM: PORTABLE CHEST 1 VIEW COMPARISON:  None. FINDINGS: The heart size and mediastinal contours are within normal limits. Both lungs are clear. The visualized skeletal structures are unremarkable. IMPRESSION: No active cardiopulmonary disease. Electronically Signed   By: Sherian Rein M.D.   On: 09/12/2016 18:50   US Abdomen Limited Ruq  Result Date: 09/13/2016 CLINICAL DATA:  Bilirubinemia for 1 day. EXAM: ULTRASOUND ABDOMEN LIMITED RIGHT UPPER QUADRANT COMPARISON:  CT 09/12/2016 FINDINGS: Gallbladder: Small volume intraluminal sludge. No calculi. No mural thickening or pericholecystic fluid. No tenderness to probe pressure. Common bile duct: Diameter: Normal, 4.1 mm. Liver: No focal lesion identified. Increased parenchymal echogenicity. Portal vein is patent on color Doppler imaging with normal direction  of blood flow towards the liver. IMPRESSION: Normal gallbladder and bile ducts. Mildly increased hepatic parenchymal echogenicity may represent fatty infiltration or hepatocellular disease. Electronically Signed   By: Ellery Plunk M.D.   On: 09/13/2016 02:01        Scheduled Meds: . heparin  5,000 Units Subcutaneous Q8H   Continuous Infusions: . sodium chloride       LOS: 1 day    Time spent: 35 minutes    Clint Lipps, MD [facility] Pager [phone]  If 7PM-7AM, please contact night-coverage [email or web address] Password [email or web address] 09/13/2016, 11:20 AM

## 2016-09-13 NOTE — Progress Notes (Signed)
  Echocardiogram 2D Echocardiogram has been performed.  Delcie RochENNINGTON, [place] Culbreth 09/13/2016, 1:06 PM

## 2016-09-13 NOTE — ED Notes (Signed)
Attending paged about bed assignment

## 2016-09-13 NOTE — ED Notes (Signed)
Breakfast requested 

## 2016-09-13 NOTE — ED Notes (Signed)
The pt returned from  Mri and ultra sound

## 2016-09-14 ENCOUNTER — Encounter (HOSPITAL_COMMUNITY): Payer: Self-pay

## 2016-09-14 LAB — BASIC METABOLIC PANEL
ANION GAP: 10 (ref 5–15)
BUN: 11 mg/dL (ref 6–20)
CHLORIDE: 101 mmol/L (ref 101–111)
CO2: 20 mmol/L — AB (ref 22–32)
Calcium: 7.7 mg/dL — ABNORMAL LOW (ref 8.9–10.3)
Creatinine, Ser: 0.92 mg/dL (ref 0.61–1.24)
GFR calc non Af Amer: >60 mL/min (ref >60)
Glucose, Bld: 117 mg/dL — ABNORMAL HIGH (ref 65–99)
POTASSIUM: 3 mmol/L — AB (ref 3.5–5.1)
SODIUM: 131 mmol/L — AB (ref 135–145)

## 2016-09-14 LAB — RPR: RPR Ser Ql: NONREACTIVE

## 2016-09-14 LAB — CBC
HEMATOCRIT: 30.2 % — AB (ref 39.0–52.0)
HEMOGLOBIN: 9.5 g/dL — AB (ref 13.0–17.0)
MCH: 26.2 pg (ref 26.0–34.0)
MCHC: 31.5 g/dL (ref 30.0–36.0)
MCV: 83.4 fL (ref 78.0–100.0)
Platelets: 312 10*3/uL (ref 150–400)
RBC: 3.62 MIL/uL — AB (ref 4.22–5.81)
RDW: 16.3 % — ABNORMAL HIGH (ref 11.5–15.5)
WBC: 7.7 10*3/uL (ref 4.0–10.5)

## 2016-09-14 LAB — MAGNESIUM: MAGNESIUM: 1.8 mg/dL (ref 1.7–2.4)

## 2016-09-14 LAB — PHOSPHORUS: PHOSPHORUS: 3.1 mg/dL (ref 2.5–4.6)

## 2016-09-14 LAB — GLUCOSE, CAPILLARY: GLUCOSE-CAPILLARY: 114 mg/dL — AB (ref 65–99)

## 2016-09-14 MED ORDER — POTASSIUM CHLORIDE 20 MEQ/15ML (10%) PO SOLN: 40.0000 meq | Freq: Once | ORAL | Status: DC

## 2016-09-14 MED ORDER — SODIUM CHLORIDE 0.9 % IV BOLUS (SEPSIS)
1000.0000 mL | Freq: Once | INTRAVENOUS | Status: AC
  Administered 2016-09-14: 1000 mL via INTRAVENOUS

## 2016-09-14 MED ORDER — POTASSIUM CHLORIDE CRYS ER 20 MEQ PO TBCR
40.0000 meq | EXTENDED_RELEASE_TABLET | Freq: Four times a day (QID) | ORAL | Status: AC
  Administered 2016-09-14 (×2): 40 meq via ORAL
  Filled 2016-09-14 (×2): qty 2

## 2016-09-14 MED ORDER — FUROSEMIDE 10 MG/ML IJ SOLN
40.0000 mg | Freq: Two times a day (BID) | INTRAMUSCULAR | Status: DC
  Administered 2016-09-14: 40 mg via INTRAVENOUS
  Filled 2016-09-14: qty 4

## 2016-09-14 NOTE — Progress Notes (Signed)
PROGRESS NOTE  Jason Humphrey  ZOX:[id number] DOB: 12-Jun-1955 DOA: 09/12/2016 PCP: Pecola Lawless, MD Outpatient Specialists:  Subjective: Looks of his swelling is improving, keep leg elevation. Patient was tachycardic this morning, had a syncopal episode with physical therapy. Lasix discontinued, given IV fluids  Brief Narrative:  Pt is a 61 yo male with hx of BPh who was brought with cc of dyspnea on exertion and leg swelling for the past month that has gotten worse progressively. He went to UC today and was found to be hypotensive at 85 systolic so he was sent to the ED. Pt said he has had dyspnea on exertion for a month w/o chest pain , wheezing, cough, fever, chills. He also denied PND or orthopnea. He denied hx of MI/CVA/HTN. He denied any other complaints but in ROS reported legs to be swollen and yellow. He has had fatigue and tiredness but no weight loss. He sometimes feels his heart rate is racing especially when walking.   Assessment & Plan:   Principal Problem:   General weakness Active Problems:   BPH (benign prostatic hyperplasia)   Hypotension   Bilateral lower extremity edema   Anemia   Anemia -Normocytic anemia patient presented to the hospital with hemoglobin of 6.8. -Check an anemia panel, this is a normocytic anemia. -Patient denies any bleeding or dark stools. -Transfuse 2 units of packed RBCs, check hemoglobin after transfusion.  Bilateral lower extremity swelling -Check 2-D echo, his BNP is not elevated, has albumin of 2.6. -This could be secondary to third spacing. -He also has lower sodium level. Elevate lower extremity  Hypernatremia -Sodium is 128, will start trickle IV fluid hydration and Lasix at same time. -Check BMP in a.m.  Generalized weakness -This is of multiple reasons, he has very poor nutritional condition, lower extremity edema and deconditioning. -PT OT to see.  Back pain -Midback pain and bilateral generalized weakness. Pain is  severe worse since yesterday. -We'll get CT scan of his back.  Hypernatremia -This is related to diuresis, replete with oral supplements.    DVT prophylaxis:  Code Status: Full Code Family Communication:  Disposition Plan:  Diet: Diet regular Room service appropriate? Yes; Fluid consistency: Thin  Consultants:   None  Procedures:   None  Antimicrobials:   None   Objective: Vitals:   09/13/16 1330 09/13/16 1441 09/13/16 2235 09/14/16 0655  BP: (!) 151/88 (!) 152/79 (!) 153/74 135/79  Pulse: 80 73 77 91  Resp: 12 20 18 18   Temp:  98.2 F (36.8 C) 98.2 F (36.8 C) 98.3 F (36.8 C)  TempSrc:  Oral Oral Oral  SpO2: 100% 100% 100% 100%  Weight:  81.6 kg (180 lb)    Height:  5\' 10"  (1.778 m)      Intake/Output Summary (Last 24 hours) at 09/14/16 1244 Last data filed at 09/14/16 1115  Gross per 24 hour  Intake             1080 ml  Output             6300 ml  Net            -5220 ml   Filed Weights   09/13/16 1441  Weight: 81.6 kg (180 lb)    Examination: General exam: Appears calm and comfortable  Respiratory system: Clear to auscultation. Respiratory effort normal. Cardiovascular system: S1 & S2 heard, RRR. No JVD, murmurs, rubs, gallops or clicks. No pedal edema. Gastrointestinal system: Abdomen is nondistended, soft and nontender.  No organomegaly or masses felt. Normal bowel sounds heard. Central nervous system: Alert and oriented. No focal neurological deficits. Extremities: Symmetric 5 x 5 power. Skin: No rashes, lesions or ulcers Psychiatry: Judgement and insight appear normal. Mood & affect appropriate.   Data Reviewed: I have personally reviewed following labs and imaging studies  CBC:  Recent Labs Lab 09/12/16 1810 09/12/16 1845 09/12/16 1855 09/12/16 2350 09/13/16 1438 09/14/16 0316  WBC 7.5  --  7.2 4.8 6.6 7.7  NEUTROABS 6.2  --   --   --   --   --   HGB 8.4* 9.2* 8.2* 6.8* 9.2* 9.5*  HCT 27.7* 27.0* 27.1* 22.5* 29.3* 30.2*  MCV 82.2   --  82.6 82.1 84.0 83.4  PLT 553*  --  440* 340 352 312   Basic Metabolic Panel:  Recent Labs Lab 09/12/16 1810 09/12/16 1845 09/12/16 1855 09/12/16 2350 09/14/16 0316  NA 130* 131* 129* 128* 131*  K 4.4 4.1 4.4 4.1 3.0*  CL 99* 101 99* 101 101  CO2 14*  --  15* 14* 20*  GLUCOSE 135* 114* 115* 98 117*  BUN 23* 23* 25* 23* 11  CREATININE 1.51* 1.20 1.60* 1.30* 0.92  CALCIUM 8.5*  --  8.2* 7.6* 7.7*  MG  --   --   --   --  1.8  PHOS  --   --   --   --  3.1   GFR: Estimated Creatinine Clearance: 86 mL/min (by C-G formula based on SCr of 0.92 mg/dL). Liver Function Tests:  Recent Labs Lab 09/12/16 1810 09/12/16 1855 09/12/16 2350  AST 50* 26 21  ALT 20 17 16*  ALKPHOS 78 71 61  BILITOT 3.3* 2.9* 2.3*  PROT 6.7 6.4* 5.5*  ALBUMIN 3.0* 3.0* 2.6*    Recent Labs Lab 09/12/16 2101  LIPASE 33    Recent Labs Lab 09/12/16 2101  AMMONIA 24   Coagulation Profile:  Recent Labs Lab 09/12/16 2101  INR 1.27   Cardiac Enzymes: No results for input(s): CKTOTAL, CKMB, CKMBINDEX, TROPONINI in the last 168 hours. BNP (last 3 results) No results for input(s): PROBNP in the last 8760 hours. HbA1C:  Recent Labs  09/13/16 1438  HGBA1C 4.1*   CBG:  Recent Labs Lab 09/12/16 2148 09/14/16 0811  GLUCAP 95 114*   Lipid Profile: No results for input(s): CHOL, HDL, LDLCALC, TRIG, CHOLHDL, LDLDIRECT in the last 72 hours. Thyroid Function Tests:  Recent Labs  09/12/16 2350  TSH 1.662   Anemia Panel:  Recent Labs  09/12/16 2350 09/13/16 0001 09/13/16 1438  VITAMINB12  --   --  197  FOLATE  --   --  3.8*  FERRITIN 75  --   --   TIBC 298  --   --   IRON 23*  --   --   RETICCTPCT  --  6.8*  --    Urine analysis:    Component Value Date/Time   COLORURINE AMBER (A) 09/12/2016 2146   APPEARANCEUR CLEAR 09/12/2016 2146   LABSPEC 1.030 09/12/2016 2146   PHURINE 5.0 09/12/2016 2146   GLUCOSEU 50 (A) 09/12/2016 2146   HGBUR SMALL (A) 09/12/2016 2146    HGBUR negative 10/19/2007 0912   BILIRUBINUR NEGATIVE 09/12/2016 2146   KETONESUR 20 (A) 09/12/2016 2146   PROTEINUR NEGATIVE 09/12/2016 2146   UROBILINOGEN 0.2 10/19/2007 0912   NITRITE NEGATIVE 09/12/2016 2146   LEUKOCYTESUR NEGATIVE 09/12/2016 2146   Sepsis Labs: @LABRCNTIP (procalcitonin:4,lacticidven:4)  ) Recent Results (from the  past 240 hour(s))  Blood culture (routine x 2)     Status: None (Preliminary result)   Collection Time: 09/12/16  6:55 PM  Result Value Ref Range Status   Specimen Description BLOOD LEFT ANTECUBITAL  Final   Special Requests   Final    BOTTLES DRAWN AEROBIC AND ANAEROBIC Blood Culture adequate volume   Culture NO GROWTH 2 DAYS  Final   Report Status PENDING  Incomplete  Blood culture (routine x 2)     Status: None (Preliminary result)   Collection Time: 09/12/16  9:01 PM  Result Value Ref Range Status   Specimen Description BLOOD LEFT FOREARM  Final   Special Requests IN PEDIATRIC BOTTLE Blood Culture adequate volume  Final   Culture NO GROWTH 2 DAYS  Final   Report Status PENDING  Incomplete     Invalid input(s): PROCALCITONIN, LACTICACIDVEN   Radiology Studies: Ct Abdomen Pelvis W Contrast  Result Date: 09/12/2016 CLINICAL DATA:  Swelling in the legs weakness with hard bowel movement EXAM: CT ABDOMEN AND PELVIS WITH CONTRAST TECHNIQUE: Multidetector CT imaging of the abdomen and pelvis was performed using the standard protocol following bolus administration of intravenous contrast. CONTRAST:  80mL ISOVUE-300 IOPAMIDOL (ISOVUE-300) INJECTION 61% COMPARISON:  None. FINDINGS: Lower chest: No acute abnormality. Hepatobiliary: No calcified gallstones or biliary dilatation. Scattered subcentimeter liver hypodensities too small to further characterize Pancreas: Unremarkable. No pancreatic ductal dilatation or surrounding inflammatory changes. Spleen: Normal in size without focal abnormality. Adrenals/Urinary Tract: Adrenal glands are within normal limits.  Multiple hypodensities within the region of renal collecting systems bilaterally, these do not appear to fill in with contrast on delayed images and are suggestive of parapelvic cysts. Negative for hydroureter or ureteral stone. The bladder is normal Stomach/Bowel: Stomach is within normal limits. Appendix not visualized consistent with history of appendectomy. Sigmoid colon diverticula without acute inflammation No evidence of bowel wall thickening, distention, or inflammatory changes. Vascular/Lymphatic: No significant vascular findings are present. No enlarged abdominal or pelvic lymph nodes. Reproductive: Enlarged prostate with mass effect on the bladder. Heterogeneous in appearance Other: Negative for free air or free fluid Musculoskeletal: Moderate compression of L1.  Degenerative changes. IMPRESSION: 1. Negative for bowel obstruction or acute intra-abdominal or pelvic pathology. Sigmoid colon diverticular disease without acute inflammation 2. Cystic density in the region of renal collecting systems bilaterally without filling on delayed images suggesting parapelvic cysts. Negative for ureteral dilatation or ureteral stone 3. Enlarged heterogeneous prostate with mass effect on the bladder. Clinical correlation recommended 4. Moderate compression of L1 Electronically Signed   By: Jasmine PangKim  Fujinaga M.D.   On: 09/12/2016 21:29   Mr Maxine GlennMra Lower Extremity Bilateral Wo Contrast  Result Date: 09/13/2016 CLINICAL DATA:  Pain and swelling of the extremity EXAM: MR MRA OF THE BILATERAL LOWER EXTREMITY WITHOUT CONTRAST TECHNIQUE: Angiographic images of the extremity were obtained using MRA technique without intravenous contrast. COMPARISON:  CT 09/12/2016 FINDINGS: There is artifact at the level of the knees. There is excessive motion artifact which significantly limits diagnostic quality of the images. Lower extremity arterial system is only segmentally visualized due to the excessive motion and artifact. No obvious marrow  edema within the imaged osseous structures. There are bilateral knee effusions. Subcutaneous and soft tissue edema within the bilateral thighs and lower legs, though poorly evaluated due to single plane of imaging and significant artifacts as described above. IMPRESSION: Very limited study. Significant motion and artifact degradation allows for only segmental visualization of the lower extremity vasculature.Bilateral knee effusions, small.  Subcutaneous and soft tissue edema within the bilateral thighs and lower legs. Electronically Signed   By: Jasmine Pang M.D.   On: 09/13/2016 02:23   Dg Chest Portable 1 View  Result Date: 09/12/2016 CLINICAL DATA:  Shortness breath on exertion for 2 weeks EXAM: PORTABLE CHEST 1 VIEW COMPARISON:  None. FINDINGS: The heart size and mediastinal contours are within normal limits. Both lungs are clear. The visualized skeletal structures are unremarkable. IMPRESSION: No active cardiopulmonary disease. Electronically Signed   By: Sherian Rein M.D.   On: 09/12/2016 18:50   US Abdomen Limited Ruq  Result Date: 09/13/2016 CLINICAL DATA:  Bilirubinemia for 1 day. EXAM: ULTRASOUND ABDOMEN LIMITED RIGHT UPPER QUADRANT COMPARISON:  CT 09/12/2016 FINDINGS: Gallbladder: Small volume intraluminal sludge. No calculi. No mural thickening or pericholecystic fluid. No tenderness to probe pressure. Common bile duct: Diameter: Normal, 4.1 mm. Liver: No focal lesion identified. Increased parenchymal echogenicity. Portal vein is patent on color Doppler imaging with normal direction of blood flow towards the liver. IMPRESSION: Normal gallbladder and bile ducts. Mildly increased hepatic parenchymal echogenicity may represent fatty infiltration or hepatocellular disease. Electronically Signed   By: Ellery Plunk M.D.   On: 09/13/2016 02:01        Scheduled Meds: . feeding supplement (ENSURE ENLIVE)  237 mL Oral BID BM  . heparin  5,000 Units Subcutaneous Q8H  . potassium chloride  40  mEq Per Tube Once   Continuous Infusions:    LOS: 2 days    Time spent: 35 minutes    Clint Lipps, MD [facility] Pager [phone]  If 7PM-7AM, please contact night-coverage [email or web address] Password [email or web address] 09/14/2016, 12:44 PM

## 2016-09-14 NOTE — Evaluation (Signed)
Physical Therapy Evaluation Patient Details Name: Jason Humphrey MRN: [id number] DOB: Feb 12, 1955 Today's Date: 09/14/2016   History of Present Illness  pt is a 61 y/o male with pmh significant for BPH, admitted with c/o dyspnea on exertion and bil LE edema, worsening over the past month. associated with fatigue and tiredness.  Clinical Impression  Pt admitted with/for above complications incl DOE and bil LE edema.  Presently, pt needs min to mod assist for mobility and gait and was limited by blood pressure and tachycardia on eval.  Pt currently limited functionally due to the problems listed. ( See problems list.)   Pt will benefit from PT to maximize function and safety in order to get ready for next venue listed below.     Follow Up Recommendations SNF;Supervision/Assistance - 24 hour    Equipment Recommendations  Other (comment) (TBA)    Recommendations for Other Services       Precautions / Restrictions Precautions Precautions: Fall      Mobility  Bed Mobility Overal bed mobility: Needs Assistance Bed Mobility: Rolling;Sidelying to Sit Rolling: Min assist Sidelying to sit: Min assist       General bed mobility comments: assisted bending knees up, minimal assis to roll and some stability assist as pt struggle to sit up.  Transfers Overall transfer level: Needs assistance   Transfers: Sit to/from Stand;Stand Pivot Transfers Sit to Stand: Min assist Stand pivot transfers: Min assist       General transfer comment: cues for hand placement safety, both lift and forward assist  Ambulation/Gait Ambulation/Gait assistance: Mod assist Ambulation Distance (Feet): 5 Feet (before pt passed out and landed in a chair) Assistive device: Rolling walker (2 wheeled) Gait Pattern/deviations: Step-through pattern   Gait velocity interpretation: Below normal speed for age/gender General Gait Details: short uncoordinated steps.  Stairs            Wheelchair Mobility     Modified Rankin (Stroke Patients Only)       Balance Overall balance assessment: Needs assistance Sitting-balance support: No upper extremity supported;Single extremity supported Sitting balance-Leahy Scale: Fair     Standing balance support: Single extremity supported;Bilateral upper extremity supported Standing balance-Leahy Scale: Poor Standing balance comment: needed external support                             Pertinent Vitals/Pain Pain Assessment: Faces Faces Pain Scale: No hurt    Home Living Family/patient expects to be discharged to:: Skilled nursing facility Living Arrangements: Alone               Additional Comments: lives in a mobilie home with 2+ steps front, no rails and 7-8 steps in back wiith rails.  Prior to this recent month, pt reported able to walk in the house ond not have to use an AD, but in the last month, has been unable to walk and has used a rolling computer chair to get to/from bathroom  Used like a w/c    Prior Function Level of Independence: Independent         Comments: Lately has not been able to ambulate.     Hand Dominance        Extremity/Trunk Assessment             Cervical / Trunk Assessment Cervical / Trunk Assessment: Lordotic  Communication   Communication: No difficulties  Cognition Arousal/Alertness: Awake/alert Behavior During Therapy: WFL for tasks assessed/performed Overall Cognitive Status:  Within Functional Limits for tasks assessed                                        General Comments General comments (skin integrity, edema, etc.): While up sitting EOB, pt's HR climbed to the 150's bpmwhereas at rest lying HR 105 bpm    Exercises     Assessment/Plan    PT Assessment Patient needs continued PT services  PT Problem List Decreased strength;Decreased activity tolerance;Decreased balance;Decreased mobility;Cardiopulmonary status limiting activity;Decreased knowledge of  use of DME;Pain       PT Treatment Interventions DME instruction;Gait training;Functional mobility training;Therapeutic activities;Therapeutic exercise;Balance training;Patient/family education    PT Goals (Current goals can be found in the Care Plan section)  Acute Rehab PT Goals Patient Stated Goal: To get back independent at home PT Goal Formulation: With patient Time For Goal Achievement: 09/28/16 Potential to Achieve Goals: Good    Frequency Min 3X/week   Barriers to discharge Decreased caregiver support      Co-evaluation               AM-PAC PT "6 Clicks" Daily Activity  Outcome Measure Difficulty turning over in bed (including adjusting bedclothes, sheets and blankets)?: Unable Difficulty moving from lying on back to sitting on the side of the bed? : Unable Difficulty sitting down on and standing up from a chair with arms (e.g., wheelchair, bedside commode, etc,.)?: Unable Help needed moving to and from a bed to chair (including a wheelchair)?: A Lot Help needed walking in hospital room?: A Lot Help needed climbing 3-5 steps with a railing? : A Lot 6 Click Score: 9    End of Session   Activity Tolerance: Other (comment) (limited by syncopal episode) Patient left: in bed;with call bell/phone within reach;with bed alarm set Nurse Communication: Mobility status PT Visit Diagnosis: Unsteadiness on feet (R26.81);Muscle weakness (generalized) (M62.81);Pain;Other abnormalities of gait and mobility (R26.89) Pain - Right/Left:  (both) Pain - part of body: Leg (bil)    Time: 1610-9604 PT Time Calculation (min) (ACUTE ONLY): 34 min   Charges:   PT Evaluation $PT Eval Moderate Complexity: 1 Mod PT Treatments $Therapeutic Activity: 8-22 mins   PT G Codes:        09-24-2016  [place] Bing, PT [phone]  (pager)  Eliseo Gum Juliya Magill 09-24-2016, 5:01 PM

## 2016-09-14 NOTE — Progress Notes (Signed)
Patient heart rate 120's-140's this am. Arthor CaptainElmahi MD made aware.

## 2016-09-14 NOTE — Progress Notes (Signed)
Per tele, pt heart rate in 150's. Pt complains of fatigue only, denies palpitations or chest pain. Upon standing, patient had a syncopal episode for about 15 seconds. Pt was sat on a chair, no injuries at this time and heart rate at 160. Once patient was placed back in bed, heart rate decreased to 105. Elmahi MD notified and made aware. Will continue to monitor.

## 2016-09-14 NOTE — Progress Notes (Signed)
Nutrition Brief Note  Patient identified on the Malnutrition Screening Tool (MST) Report.  Wt Readings from Last 15 Encounters:  09/13/16 180 lb (81.6 kg)  09/07/11 180 lb (81.6 kg)  06/25/09 197 lb 12.8 oz (89.7 kg)  03/26/08 196 lb 12.8 oz (89.3 kg)  10/19/07 201 lb 3.2 oz (91.3 kg)  03/18/07 208 lb 8 oz (94.6 kg)   Body mass index is 25.83 kg/m. Patient meets criteria for Overweight based on current BMI.   Current diet order is Regular, patient is consuming approximately 50% of meals at this time.  Ensure Enlive supplemented ordered BID.   Labs and medications reviewed. CBG's 95-114.  No further nutrition interventions warranted at this time. If nutrition issues arise, please consult RD.   Maureen ChattersKatie Mirriam Vadala, RD, LDN Pager #: [phone] After-Hours Pager #: [phone]

## 2016-09-15 LAB — URINALYSIS, ROUTINE W REFLEX MICROSCOPIC
Bilirubin Urine: NEGATIVE
GLUCOSE, UA: NEGATIVE mg/dL
Ketones, ur: NEGATIVE mg/dL
NITRITE: NEGATIVE
Protein, ur: 30 mg/dL — AB
SPECIFIC GRAVITY, URINE: 1.028 (ref 1.005–1.030)
pH: 5 (ref 5.0–8.0)

## 2016-09-15 LAB — HEPATITIS PANEL, ACUTE
HCV Ab: 0.1 s/co ratio (ref 0.0–0.9)
HEP B S AG: NEGATIVE
Hep A IgM: NEGATIVE
Hep B C IgM: NEGATIVE

## 2016-09-15 LAB — BASIC METABOLIC PANEL
Anion gap: 7 (ref 5–15)
BUN: 8 mg/dL (ref 6–20)
CALCIUM: 7.9 mg/dL — AB (ref 8.9–10.3)
CO2: 24 mmol/L (ref 22–32)
CREATININE: 0.87 mg/dL (ref 0.61–1.24)
Chloride: 100 mmol/L — ABNORMAL LOW (ref 101–111)
GFR calc non Af Amer: >60 mL/min (ref >60)
Glucose, Bld: 102 mg/dL — ABNORMAL HIGH (ref 65–99)
Potassium: 3.9 mmol/L (ref 3.5–5.1)
SODIUM: 131 mmol/L — AB (ref 135–145)

## 2016-09-15 LAB — HAPTOGLOBIN: HAPTOGLOBIN: 148 mg/dL (ref 34–200)

## 2016-09-15 MED ORDER — FOLIC ACID 5 MG/ML IJ SOLN
1.0000 mg | Freq: Every day | INTRAMUSCULAR | Status: DC
  Administered 2016-09-15 – 2016-09-16 (×2): 1 mg via INTRAVENOUS
  Filled 2016-09-15 (×2): qty 0.2

## 2016-09-15 MED ORDER — NA FERRIC GLUC CPLX IN SUCROSE 12.5 MG/ML IV SOLN
250.0000 mg | Freq: Once | INTRAVENOUS | Status: AC
  Administered 2016-09-15: 250 mg via INTRAVENOUS
  Filled 2016-09-15: qty 20

## 2016-09-15 MED ORDER — ADULT MULTIVITAMIN W/MINERALS CH
1.0000 | ORAL_TABLET | Freq: Every day | ORAL | Status: DC
  Administered 2016-09-15 – 2016-09-16 (×2): 1 via ORAL
  Filled 2016-09-15 (×2): qty 1

## 2016-09-15 MED ORDER — POTASSIUM CHLORIDE IN NACL 20-0.9 MEQ/L-% IV SOLN
INTRAVENOUS | Status: DC
  Administered 2016-09-15 – 2016-09-16 (×2): via INTRAVENOUS
  Filled 2016-09-15 (×3): qty 1000

## 2016-09-15 MED ORDER — CYANOCOBALAMIN 1000 MCG/ML IJ SOLN
1000.0000 ug | Freq: Once | INTRAMUSCULAR | Status: AC
  Administered 2016-09-15: 1000 ug via INTRAMUSCULAR
  Filled 2016-09-15: qty 1

## 2016-09-15 NOTE — Progress Notes (Signed)
Patient ambulated with PT down the hall. HR sustained in the upper 140's to 150's, tired but asypmtomatic. Arthor CaptainElmahi, MD notified. Will continue to monitor.  Sherlon HandingElisa Oletha Tolson, RN

## 2016-09-15 NOTE — Progress Notes (Signed)
PROGRESS NOTE  Jason Humphrey  ZOX:[id number] DOB: 18-Feb-1955 DOA: 09/12/2016 PCP: Pecola Lawless, MD Outpatient Specialists:  Subjective: No complaints this morning, he was complaining about back pain yesterday this is resolved. His urine looks very concentrated, I'll repeat the UA. Start IV fluids as his leg swelling improved.  Brief Narrative:  Pt is a 61 yo male with hx of BPh who was brought with cc of dyspnea on exertion and leg swelling for the past month that has gotten worse progressively. He went to UC today and was found to be hypotensive at 85 systolic so he was sent to the ED. Pt said he has had dyspnea on exertion for a month w/o chest pain , wheezing, cough, fever, chills. He also denied PND or orthopnea. He denied hx of MI/CVA/HTN. He denied any other complaints but in ROS reported legs to be swollen and yellow. He has had fatigue and tiredness but no weight loss. He sometimes feels his heart rate is racing especially when walking.   Assessment & Plan:   Principal Problem:   General weakness Active Problems:   BPH (benign prostatic hyperplasia)   Hypotension   Bilateral lower extremity edema   Anemia   Anemia -Normocytic anemia patient presented to the hospital with hemoglobin of 6.8. -Check an anemia panel, this is a normocytic anemia. -Patient denies any bleeding or dark stools. -Transfuse 2 units of packed RBCs, hemoglobin is 9.5 after transfusion. -Iron is in the low side of 0.3, folate low at 3.8 and B12 is in the low side at 197, replete all.  Acute kidney injury -Presented with creatinine of 1.6 and BUN of 25 suggesting a GI from dehydration. -Creatinine 0.87 and BUN is 8, improved after hydration with IV fluids.  Bilateral lower extremity swelling -2-D echo showed normal LVEF, patient has low albumin which probably causing his bilateral lower extremity edema. -This could be secondary to third spacing. -Elevate legs.  Hypernatremia -Sodium is 128, will  start trickle IV fluid hydration and Lasix at same time. -Is improving with IV fluids. Check BMP in a.m.  Generalized weakness -This is of multiple reasons, he has very poor nutritional condition, lower extremity edema and deconditioning. -PT OT to see.  Back pain -Midback pain and bilateral generalized weakness. Pain is severe worse since yesterday. -We'll get CT scan of his back.  Hypernatremia -This is related to diuresis, replete with oral supplements.  Metabolic acidosis -Non-anion gap metabolic acidosis, bicarbonate was 14 on admission, this was likely secondary to acute kidney injury. -This is resolved   DVT prophylaxis:  Code Status: Full Code Family Communication:  Disposition Plan:  Diet: Diet regular Room service appropriate? Yes; Fluid consistency: Thin  Consultants:   None  Procedures:   None  Antimicrobials:   None   Objective: Vitals:   09/14/16 0655 09/14/16 1420 09/14/16 2126 09/15/16 0556  BP: 135/79 (!) 116/93 (!) 125/56 110/71  Pulse: 91 (!) 103 70 (!) 106  Resp: 18 18 18 18   Temp: 98.3 F (36.8 C) 98.2 F (36.8 C) 98 F (36.7 C) 98.7 F (37.1 C)  TempSrc: Oral Oral Oral Oral  SpO2: 100% 100% 98% 100%  Weight:      Height:        Intake/Output Summary (Last 24 hours) at 09/15/16 1326 Last data filed at 09/15/16 0553  Gross per 24 hour  Intake               40 ml  Output  800 ml  Net             -760 ml   Filed Weights   09/13/16 1441  Weight: 81.6 kg (180 lb)    Examination: General exam: Appears calm and comfortable  Respiratory system: Clear to auscultation. Respiratory effort normal. Cardiovascular system: S1 & S2 heard, RRR. No JVD, murmurs, rubs, gallops or clicks. No pedal edema. Gastrointestinal system: Abdomen is nondistended, soft and nontender. No organomegaly or masses felt. Normal bowel sounds heard. Central nervous system: Alert and oriented. No focal neurological deficits. Extremities: Symmetric 5 x  5 power. Skin: No rashes, lesions or ulcers Psychiatry: Judgement and insight appear normal. Mood & affect appropriate.   Data Reviewed: I have personally reviewed following labs and imaging studies  CBC:  Recent Labs Lab 09/12/16 1810 09/12/16 1845 09/12/16 1855 09/12/16 2350 09/13/16 1438 09/14/16 0316  WBC 7.5  --  7.2 4.8 6.6 7.7  NEUTROABS 6.2  --   --   --   --   --   HGB 8.4* 9.2* 8.2* 6.8* 9.2* 9.5*  HCT 27.7* 27.0* 27.1* 22.5* 29.3* 30.2*  MCV 82.2  --  82.6 82.1 84.0 83.4  PLT 553*  --  440* 340 352 312   Basic Metabolic Panel:  Recent Labs Lab 09/12/16 1810 09/12/16 1845 09/12/16 1855 09/12/16 2350 09/14/16 0316 09/15/16 0526  NA 130* 131* 129* 128* 131* 131*  K 4.4 4.1 4.4 4.1 3.0* 3.9  CL 99* 101 99* 101 101 100*  CO2 14*  --  15* 14* 20* 24  GLUCOSE 135* 114* 115* 98 117* 102*  BUN 23* 23* 25* 23* 11 8  CREATININE 1.51* 1.20 1.60* 1.30* 0.92 0.87  CALCIUM 8.5*  --  8.2* 7.6* 7.7* 7.9*  MG  --   --   --   --  1.8  --   PHOS  --   --   --   --  3.1  --    GFR: Estimated Creatinine Clearance: 90.9 mL/min (by C-G formula based on SCr of 0.87 mg/dL). Liver Function Tests:  Recent Labs Lab 09/12/16 1810 09/12/16 1855 09/12/16 2350  AST 50* 26 21  ALT 20 17 16*  ALKPHOS 78 71 61  BILITOT 3.3* 2.9* 2.3*  PROT 6.7 6.4* 5.5*  ALBUMIN 3.0* 3.0* 2.6*    Recent Labs Lab 09/12/16 2101  LIPASE 33    Recent Labs Lab 09/12/16 2101  AMMONIA 24   Coagulation Profile:  Recent Labs Lab 09/12/16 2101  INR 1.27   Cardiac Enzymes: No results for input(s): CKTOTAL, CKMB, CKMBINDEX, TROPONINI in the last 168 hours. BNP (last 3 results) No results for input(s): PROBNP in the last 8760 hours. HbA1C:  Recent Labs  09/13/16 1438  HGBA1C 4.1*   CBG:  Recent Labs Lab 09/12/16 2148 09/14/16 0811  GLUCAP 95 114*   Lipid Profile: No results for input(s): CHOL, HDL, LDLCALC, TRIG, CHOLHDL, LDLDIRECT in the last 72 hours. Thyroid Function  Tests:  Recent Labs  09/12/16 2350  TSH 1.662   Anemia Panel:  Recent Labs  09/12/16 2350 09/13/16 0001 09/13/16 1438  VITAMINB12  --   --  197  FOLATE  --   --  3.8*  FERRITIN 75  --   --   TIBC 298  --   --   IRON 23*  --   --   RETICCTPCT  --  6.8*  --    Urine analysis:    Component Value Date/Time  COLORURINE AMBER (A) 09/12/2016 2146   APPEARANCEUR CLEAR 09/12/2016 2146   LABSPEC 1.030 09/12/2016 2146   PHURINE 5.0 09/12/2016 2146   GLUCOSEU 50 (A) 09/12/2016 2146   HGBUR SMALL (A) 09/12/2016 2146   HGBUR negative 10/19/2007 0912   BILIRUBINUR NEGATIVE 09/12/2016 2146   KETONESUR 20 (A) 09/12/2016 2146   PROTEINUR NEGATIVE 09/12/2016 2146   UROBILINOGEN 0.2 10/19/2007 0912   NITRITE NEGATIVE 09/12/2016 2146   LEUKOCYTESUR NEGATIVE 09/12/2016 2146   Sepsis Labs: @LABRCNTIP (procalcitonin:4,lacticidven:4)  ) Recent Results (from the past 240 hour(s))  Blood culture (routine x 2)     Status: None (Preliminary result)   Collection Time: 09/12/16  6:55 PM  Result Value Ref Range Status   Specimen Description BLOOD LEFT ANTECUBITAL  Final   Special Requests   Final    BOTTLES DRAWN AEROBIC AND ANAEROBIC Blood Culture adequate volume   Culture NO GROWTH 2 DAYS  Final   Report Status PENDING  Incomplete  Blood culture (routine x 2)     Status: None (Preliminary result)   Collection Time: 09/12/16  9:01 PM  Result Value Ref Range Status   Specimen Description BLOOD LEFT FOREARM  Final   Special Requests IN PEDIATRIC BOTTLE Blood Culture adequate volume  Final   Culture NO GROWTH 2 DAYS  Final   Report Status PENDING  Incomplete     Invalid input(s): PROCALCITONIN, LACTICACIDVEN   Radiology Studies: No results found.      Scheduled Meds: . feeding supplement (ENSURE ENLIVE)  237 mL Oral BID BM  . heparin  5,000 Units Subcutaneous Q8H   Continuous Infusions:    LOS: 3 days    Time spent: 35 minutes    Clint Lipps, MD [facility] Pager [phone]  If 7PM-7AM, please contact night-coverage [email or web address] Password [email or web address] 09/15/2016, 1:26 PM

## 2016-09-15 NOTE — Progress Notes (Signed)
Physical Therapy Treatment Patient Details Name: Jason RiderWalter M Hollenberg MRN: [id number] DOB: Sep 20, 1955 Today's Date: 09/15/2016    History of Present Illness pt is a 61 y/o male with pmh significant for BPH, admitted with c/o dyspnea on exertion and bil LE, worsening over the past month. associated with fatigue and tiredness.    PT Comments    Much improved with mobility, gait and general strength.  Pt felt so much better that he pushed himself too far and started fatiguing far from the endpoint.  HR checked toward the end of session and found to be upper 140's to lower 150's bpm with associated mild dyspnea.  Follow Up Recommendations  SNF;Supervision/Assistance - 24 hour     Equipment Recommendations   (TBA)    Recommendations for Other Services       Precautions / Restrictions Precautions Precautions: Fall    Mobility  Bed Mobility   Bed Mobility: Supine to Sit Rolling: Supervision         General bed mobility comments: much more fluid of movement  Transfers Overall transfer level: Needs assistance   Transfers: Sit to/from Stand Sit to Stand: Supervision         General transfer comment: cues for hand placement.  no assist needed today  Ambulation/Gait Ambulation/Gait assistance: Min guard;Min assist Ambulation Distance (Feet): 380 Feet (with 2 standing rests.) Assistive device: Rolling walker (2 wheeled) Gait Pattern/deviations: Step-through pattern   Gait velocity interpretation: at or above normal speed for age/gender General Gait Details: generally steady, normalized gait, but pt pushed himself too far and started fatiguing far from end point..  During and after gait, pt's HR was sustained in the upper 140's/lower 150's bpm, pt mildly dyspneic.   Stairs            Wheelchair Mobility    Modified Rankin (Stroke Patients Only)       Balance     Sitting balance-Leahy Scale: Good       Standing balance-Leahy Scale: Fair Standing balance  comment: stable statically without RW                            Cognition Arousal/Alertness: Awake/alert Behavior During Therapy: WFL for tasks assessed/performed Overall Cognitive Status: Within Functional Limits for tasks assessed                                        Exercises Other Exercises Other Exercises: hip/knee flex/ext with significant/graded resistance    General Comments        Pertinent Vitals/Pain Pain Assessment: Faces Faces Pain Scale: No hurt    Home Living                      Prior Function            PT Goals (current goals can now be found in the care plan section) Acute Rehab PT Goals Patient Stated Goal: To get back independent at home PT Goal Formulation: With patient Time For Goal Achievement: 09/28/16 Potential to Achieve Goals: Good Progress towards PT goals: Progressing toward goals    Frequency    Min 3X/week      PT Plan Current plan remains appropriate    Co-evaluation              AM-PAC PT "6 Clicks" Daily Activity  Outcome Measure  Difficulty turning over in bed (including adjusting bedclothes, sheets and blankets)?: A Little Difficulty moving from lying on back to sitting on the side of the bed? : A Little Difficulty sitting down on and standing up from a chair with arms (e.g., wheelchair, bedside commode, etc,.)?: A Little Help needed moving to and from a bed to chair (including a wheelchair)?: A Little Help needed walking in hospital room?: A Little Help needed climbing 3-5 steps with a railing? : A Lot 6 Click Score: 17    End of Session   Activity Tolerance: Patient tolerated treatment well Patient left: in chair;with call bell/phone within reach;with chair alarm set Nurse Communication: Mobility status PT Visit Diagnosis: Unsteadiness on feet (R26.81);Other abnormalities of gait and mobility (R26.89)     Time: 1330-1404 PT Time Calculation (min) (ACUTE ONLY): 34  min  Charges:  $Gait Training: 8-22 mins $Therapeutic Activity: 8-22 mins                    G Codes:       Sep 19, 2016  Oakley Bing, PT [phone]  (pager)   Eliseo Gum Selma Rodelo 09/19/2016, 2:19 PM

## 2016-09-16 LAB — BASIC METABOLIC PANEL
Anion gap: 8 (ref 5–15)
BUN: 14 mg/dL (ref 6–20)
CHLORIDE: 103 mmol/L (ref 101–111)
CO2: 21 mmol/L — AB (ref 22–32)
CREATININE: 0.99 mg/dL (ref 0.61–1.24)
Calcium: 7.7 mg/dL — ABNORMAL LOW (ref 8.9–10.3)
GFR calc Af Amer: >60 mL/min (ref >60)
GFR calc non Af Amer: >60 mL/min (ref >60)
GLUCOSE: 83 mg/dL (ref 65–99)
POTASSIUM: 3.9 mmol/L (ref 3.5–5.1)
Sodium: 132 mmol/L — ABNORMAL LOW (ref 135–145)

## 2016-09-16 LAB — BPAM RBC
BLOOD PRODUCT EXPIRATION DATE: 201809272359
BLOOD PRODUCT EXPIRATION DATE: 201810032359
Blood Product Expiration Date: 201810032359
Blood Product Expiration Date: 201810032359
ISSUE DATE / TIME: 201809020516
ISSUE DATE / TIME: 201809020824
UNIT TYPE AND RH: 5100
UNIT TYPE AND RH: 5100
Unit Type and Rh: 5100
Unit Type and Rh: 5100

## 2016-09-16 LAB — TYPE AND SCREEN
ABO/RH(D): O POS
ANTIBODY SCREEN: NEGATIVE
Unit division: 0
Unit division: 0
Unit division: 0
Unit division: 0

## 2016-09-16 LAB — CBC
HEMATOCRIT: 30.1 % — AB (ref 39.0–52.0)
Hemoglobin: 9.6 g/dL — ABNORMAL LOW (ref 13.0–17.0)
MCH: 27.1 pg (ref 26.0–34.0)
MCHC: 31.9 g/dL (ref 30.0–36.0)
MCV: 85 fL (ref 78.0–100.0)
PLATELETS: 266 10*3/uL (ref 150–400)
RBC: 3.54 MIL/uL — ABNORMAL LOW (ref 4.22–5.81)
RDW: 16.9 % — AB (ref 11.5–15.5)
WBC: 5.7 10*3/uL (ref 4.0–10.5)

## 2016-09-16 MED ORDER — FOSFOMYCIN TROMETHAMINE 3 G PO PACK
3.0000 g | PACK | Freq: Once | ORAL | Status: AC
  Administered 2016-09-16: 3 g via ORAL
  Filled 2016-09-16: qty 3

## 2016-09-16 MED ORDER — VITAMIN B-12 1000 MCG PO TABS: 1000.0000 ug | ORAL_TABLET | Freq: Every day | ORAL | 0 refills | Status: AC

## 2016-09-16 MED ORDER — FOLIC ACID 1 MG PO TABS
1.0000 mg | ORAL_TABLET | Freq: Every day | ORAL | 3 refills | Status: AC
Start: 2016-09-16 — End: 2017-09-16

## 2016-09-16 MED ORDER — ADULT MULTIVITAMIN W/MINERALS CH: 1.0000 | ORAL_TABLET | Freq: Every day | ORAL | 0 refills | Status: AC

## 2016-09-16 NOTE — Discharge Summary (Signed)
Physician Discharge Summary  Jason Humphrey JYN:[id number] DOB: 08/19/1955 DOA: 09/12/2016  PCP: Pecola Lawless, MD  Admit date: 09/12/2016 Discharge date: 09/16/2016  Admitted From: Home Disposition: Home  Recommendations for Outpatient Follow-up:  1. Follow up with PCP in 1-2 weeks 2. Please obtain BMP/CBC in one week  Home Health: NA Equipment/Devices:NA  Discharge Condition: Stable CODE STATUS: Full Code Diet recommendation: Diet regular Room service appropriate? Yes; Fluid consistency: Thin Diet - low sodium heart healthy  Brief/Interim Summary: Pt is a 61 yo male with hx of BPh who was brought with cc of dyspnea on exertion and leg swelling for the past month that has gotten worse progressively. He went to UC today and was found to be hypotensive at 85 systolic so he was sent to the ED. Pt said he has had dyspnea on exertion for a month w/o chest pain , wheezing, cough, fever, chills. He also denied PND or orthopnea. He denied hx of MI/CVA/HTN. He denied any other complaints but in ROS reported legs to be swollen and yellow. He has had fatigue and tiredness but no weight loss. He sometimes feels his heart rate is racing especially when walking.    Discharge Diagnoses:  Principal Problem:   General weakness Active Problems:   BPH (benign prostatic hyperplasia)   Hypotension   Bilateral lower extremity edema   Anemia   Anemia -Normocytic anemia patient presented to the hospital with hemoglobin of 6.8. -Check an anemia panel, this is a normocytic anemia. -Patient denies any bleeding or dark stools.No evidence of bleeding. -Transfuse 2 units of packed RBCs, hemoglobin is 9.5 after transfusion. -Iron is in the low side of 0.3, folate low at 3.8 and B12 is in the low side at 197, replete all. -Cannot rule out nutritional anemia, discharged on MVI, folate and B12.  Acute kidney injury -Presented with creatinine of 1.6 and BUN of 25 suggesting a GI from  dehydration. -Creatinine 0.87 and BUN is 8, improved after hydration with IV fluids.  Bilateral lower extremity swelling -2-D echo showed normal LVEF, patient has low albumin which probably causing his bilateral lower extremity edema. -This could be secondary to third spacing secondary to hypoalbuminemia. -Improved with elevation, he might benefit from Ace wraps as outpatient, if this continues.  Hyponatremia -Sodium is 128, likely secondary to dehydration. -Improved with IV fluids. Check BMP in a.m.  Generalized weakness -This is of multiple reasons, he has very poor nutritional condition, lower extremity edema and deconditioning. -PT OT to see.  Back pain -Midback pain and bilateral generalized weakness. Pain is severe worse since yesterday. -We'll get CT scan of his back.  Hypokalemia -This is related to diuresis, repleted with oral supplements.  Metabolic acidosis -Non-anion gap metabolic acidosis, bicarbonate was 14 on admission, this was likely secondary to acute kidney injury. -This is resolved  I spoke with his cousin on the day of discharge, and also discussion was CSW and the case Production designer, theatre/television/film. Initially patient was seems to be appropriate for SNF, but he improved and he was able to walk 380 feet with a walker. He is going to go home with RN and aide (likely [facility] as he does not have insurance) I spoke with his cousin that he will need health insurance, follow-up with her care physician and help with meals.  Discharge Instructions  Discharge Instructions    Diet - low sodium heart healthy    Complete by:  As directed    Increase activity slowly    Complete  by:  As directed      Allergies as of 09/16/2016      Reactions   Nabumetone Palpitations, Other (See Comments)   Tachycardia      Medication List    STOP taking these medications   meclizine 25 MG tablet Commonly known as:  ANTIVERT   promethazine 25 MG tablet Commonly known as:  PHENERGAN      TAKE these medications   folic acid 1 MG tablet Commonly known as:  FOLVITE Take 1 tablet (1 mg total) by mouth daily.   multivitamin with minerals Tabs tablet Take 1 tablet by mouth daily.   vitamin B-12 1000 MCG tablet Commonly known as:  CYANOCOBALAMIN Take 1 tablet (1,000 mcg total) by mouth daily.            Discharge Care Instructions        Start     Ordered   09/17/16 0000  Multiple Vitamin (MULTIVITAMIN WITH MINERALS) TABS tablet  Daily     09/16/16 1025   09/16/16 0000  folic acid (FOLVITE) 1 MG tablet  Daily     09/16/16 1025   09/16/16 0000  vitamin B-12 (CYANOCOBALAMIN) 1000 MCG tablet  Daily     09/16/16 1025   09/16/16 0000  Increase activity slowly     09/16/16 1025   09/16/16 0000  Diet - low sodium heart healthy     09/16/16 1025      Allergies  Allergen Reactions  . Nabumetone Palpitations and Other (See Comments)    Tachycardia     Consultations:  None   Procedures (Echo, Carotid, EGD, Colonoscopy, ERCP)   Radiological studies: Ct Abdomen Pelvis W Contrast  Result Date: 09/12/2016 CLINICAL DATA:  Swelling in the legs weakness with hard bowel movement EXAM: CT ABDOMEN AND PELVIS WITH CONTRAST TECHNIQUE: Multidetector CT imaging of the abdomen and pelvis was performed using the standard protocol following bolus administration of intravenous contrast. CONTRAST:  80mL ISOVUE-300 IOPAMIDOL (ISOVUE-300) INJECTION 61% COMPARISON:  None. FINDINGS: Lower chest: No acute abnormality. Hepatobiliary: No calcified gallstones or biliary dilatation. Scattered subcentimeter liver hypodensities too small to further characterize Pancreas: Unremarkable. No pancreatic ductal dilatation or surrounding inflammatory changes. Spleen: Normal in size without focal abnormality. Adrenals/Urinary Tract: Adrenal glands are within normal limits. Multiple hypodensities within the region of renal collecting systems bilaterally, these do not appear to fill in with contrast on  delayed images and are suggestive of parapelvic cysts. Negative for hydroureter or ureteral stone. The bladder is normal Stomach/Bowel: Stomach is within normal limits. Appendix not visualized consistent with history of appendectomy. Sigmoid colon diverticula without acute inflammation No evidence of bowel wall thickening, distention, or inflammatory changes. Vascular/Lymphatic: No significant vascular findings are present. No enlarged abdominal or pelvic lymph nodes. Reproductive: Enlarged prostate with mass effect on the bladder. Heterogeneous in appearance Other: Negative for free air or free fluid Musculoskeletal: Moderate compression of L1.  Degenerative changes. IMPRESSION: 1. Negative for bowel obstruction or acute intra-abdominal or pelvic pathology. Sigmoid colon diverticular disease without acute inflammation 2. Cystic density in the region of renal collecting systems bilaterally without filling on delayed images suggesting parapelvic cysts. Negative for ureteral dilatation or ureteral stone 3. Enlarged heterogeneous prostate with mass effect on the bladder. Clinical correlation recommended 4. Moderate compression of L1 Electronically Signed   By: Jasmine Pang M.D.   On: 09/12/2016 21:29   Mr Maxine Glenn Lower Extremity Bilateral Wo Contrast  Result Date: 09/13/2016 CLINICAL DATA:  Pain and swelling of the  extremity EXAM: MR MRA OF THE BILATERAL LOWER EXTREMITY WITHOUT CONTRAST TECHNIQUE: Angiographic images of the extremity were obtained using MRA technique without intravenous contrast. COMPARISON:  CT 09/12/2016 FINDINGS: There is artifact at the level of the knees. There is excessive motion artifact which significantly limits diagnostic quality of the images. Lower extremity arterial system is only segmentally visualized due to the excessive motion and artifact. No obvious marrow edema within the imaged osseous structures. There are bilateral knee effusions. Subcutaneous and soft tissue edema within the  bilateral thighs and lower legs, though poorly evaluated due to single plane of imaging and significant artifacts as described above. IMPRESSION: Very limited study. Significant motion and artifact degradation allows for only segmental visualization of the lower extremity vasculature.Bilateral knee effusions, small. Subcutaneous and soft tissue edema within the bilateral thighs and lower legs. Electronically Signed   By: Jasmine PangKim  Fujinaga M.D.   On: 09/13/2016 02:23   Dg Chest Portable 1 View  Result Date: 09/12/2016 CLINICAL DATA:  Shortness breath on exertion for 2 weeks EXAM: PORTABLE CHEST 1 VIEW COMPARISON:  None. FINDINGS: The heart size and mediastinal contours are within normal limits. Both lungs are clear. The visualized skeletal structures are unremarkable. IMPRESSION: No active cardiopulmonary disease. Electronically Signed   By: Sherian ReinWei-Chen  Lin M.D.   On: 09/12/2016 18:50   [place] Abdomen Limited Ruq  Result Date: 09/13/2016 CLINICAL DATA:  Bilirubinemia for 1 day. EXAM: ULTRASOUND ABDOMEN LIMITED RIGHT UPPER QUADRANT COMPARISON:  CT 09/12/2016 FINDINGS: Gallbladder: Small volume intraluminal sludge. No calculi. No mural thickening or pericholecystic fluid. No tenderness to probe pressure. Common bile duct: Diameter: Normal, 4.1 mm. Liver: No focal lesion identified. Increased parenchymal echogenicity. Portal vein is patent on color Doppler imaging with normal direction of blood flow towards the liver. IMPRESSION: Normal gallbladder and bile ducts. Mildly increased hepatic parenchymal echogenicity may represent fatty infiltration or hepatocellular disease. Electronically Signed   By: Ellery Plunkaniel R Mitchell M.D.   On: 09/13/2016 02:01     Subjective:  Discharge Exam: Vitals:   09/14/16 2126 09/15/16 0556 09/15/16 2116 09/16/16 0606  BP: (!) 125/56 110/71 96/76 123/83  Pulse: 70 (!) 106 (!) 131 (!) 101  Resp: 18 18  18   Temp: 98 F (36.7 C) 98.7 F (37.1 C) 98.9 F (37.2 C)   TempSrc: Oral Oral Oral    SpO2: 98% 100% 100% 100%  Weight:      Height:       General: Pt is alert, awake, not in acute distress Cardiovascular: RRR, S1/S2 +, no rubs, no gallops Respiratory: CTA bilaterally, no wheezing, no rhonchi Abdominal: Soft, NT, ND, bowel sounds + Extremities: no edema, no cyanosis   The results of significant diagnostics from this hospitalization (including imaging, microbiology, ancillary and laboratory) are listed below for reference.    Microbiology: Recent Results (from the past 240 hour(s))  Blood culture (routine x 2)     Status: None (Preliminary result)   Collection Time: 09/12/16  6:55 PM  Result Value Ref Range Status   Specimen Description BLOOD LEFT ANTECUBITAL  Final   Special Requests   Final    BOTTLES DRAWN AEROBIC AND ANAEROBIC Blood Culture adequate volume   Culture NO GROWTH 4 DAYS  Final   Report Status PENDING  Incomplete  Blood culture (routine x 2)     Status: None (Preliminary result)   Collection Time: 09/12/16  9:01 PM  Result Value Ref Range Status   Specimen Description BLOOD LEFT FOREARM  Final   Special  Requests IN PEDIATRIC BOTTLE Blood Culture adequate volume  Final   Culture NO GROWTH 4 DAYS  Final   Report Status PENDING  Incomplete     Labs: BNP (last 3 results)  Recent Labs  09/12/16 1855  BNP 67.9   Basic Metabolic Panel:  Recent Labs Lab 09/12/16 1855 09/12/16 2350 09/14/16 0316 09/15/16 0526 09/16/16 0532  NA 129* 128* 131* 131* 132*  K 4.4 4.1 3.0* 3.9 3.9  CL 99* 101 101 100* 103  CO2 15* 14* 20* 24 21*  GLUCOSE 115* 98 117* 102* 83  BUN 25* 23* 11 8 14   CREATININE 1.60* 1.30* 0.92 0.87 0.99  CALCIUM 8.2* 7.6* 7.7* 7.9* 7.7*  MG  --   --  1.8  --   --   PHOS  --   --  3.1  --   --    Liver Function Tests:  Recent Labs Lab 09/12/16 1810 09/12/16 1855 09/12/16 2350  AST 50* 26 21  ALT 20 17 16*  ALKPHOS 78 71 61  BILITOT 3.3* 2.9* 2.3*  PROT 6.7 6.4* 5.5*  ALBUMIN 3.0* 3.0* 2.6*    Recent Labs Lab  09/12/16 2101  LIPASE 33    Recent Labs Lab 09/12/16 2101  AMMONIA 24   CBC:  Recent Labs Lab 09/12/16 1810  09/12/16 1855 09/12/16 2350 09/13/16 1438 09/14/16 0316 09/16/16 0532  WBC 7.5  --  7.2 4.8 6.6 7.7 5.7  NEUTROABS 6.2  --   --   --   --   --   --   HGB 8.4*  < > 8.2* 6.8* 9.2* 9.5* 9.6*  HCT 27.7*  < > 27.1* 22.5* 29.3* 30.2* 30.1*  MCV 82.2  --  82.6 82.1 84.0 83.4 85.0  PLT 553*  --  440* 340 352 312 266  < > = values in this interval not displayed. Cardiac Enzymes: No results for input(s): CKTOTAL, CKMB, CKMBINDEX, TROPONINI in the last 168 hours. BNP: Invalid input(s): POCBNP CBG:  Recent Labs Lab 09/12/16 2148 09/14/16 0811  GLUCAP 95 114*   D-Dimer  Recent Labs  09/13/16 1438  DDIMER 1.20*   Hgb A1c  Recent Labs  09/13/16 1438  HGBA1C 4.1*   Lipid Profile No results for input(s): CHOL, HDL, LDLCALC, TRIG, CHOLHDL, LDLDIRECT in the last 72 hours. Thyroid function studies No results for input(s): TSH, T4TOTAL, T3FREE, THYROIDAB in the last 72 hours.  Invalid input(s): FREET3 Anemia work up  Recent Labs  09/13/16 1438  VITAMINB12 197  FOLATE 3.8*   Urinalysis    Component Value Date/Time   COLORURINE AMBER (A) 09/15/2016 1948   APPEARANCEUR CLOUDY (A) 09/15/2016 1948   LABSPEC 1.028 09/15/2016 1948   PHURINE 5.0 09/15/2016 1948   GLUCOSEU NEGATIVE 09/15/2016 1948   HGBUR MODERATE (A) 09/15/2016 1948   HGBUR negative 10/19/2007 0912   BILIRUBINUR NEGATIVE 09/15/2016 1948   KETONESUR NEGATIVE 09/15/2016 1948   PROTEINUR 30 (A) 09/15/2016 1948   UROBILINOGEN 0.2 10/19/2007 0912   NITRITE NEGATIVE 09/15/2016 1948   LEUKOCYTESUR TRACE (A) 09/15/2016 1948   Sepsis Labs Invalid input(s): PROCALCITONIN,  WBC,  LACTICIDVEN Microbiology Recent Results (from the past 240 hour(s))  Blood culture (routine x 2)     Status: None (Preliminary result)   Collection Time: 09/12/16  6:55 PM  Result Value Ref Range Status   Specimen  Description BLOOD LEFT ANTECUBITAL  Final   Special Requests   Final    BOTTLES DRAWN AEROBIC AND ANAEROBIC Blood Culture  adequate volume   Culture NO GROWTH 4 DAYS  Final   Report Status PENDING  Incomplete  Blood culture (routine x 2)     Status: None (Preliminary result)   Collection Time: 09/12/16  9:01 PM  Result Value Ref Range Status   Specimen Description BLOOD LEFT FOREARM  Final   Special Requests IN PEDIATRIC BOTTLE Blood Culture adequate volume  Final   Culture NO GROWTH 4 DAYS  Final   Report Status PENDING  Incomplete     Time coordinating discharge: Over 30 minutes  SIGNED:   Clint Lipps, MD  [facility] 09/16/2016, 10:25 AM Pager   If 7PM-7AM, please contact night-coverage [email or web address] Password [email or web address]

## 2016-09-16 NOTE — Progress Notes (Addendum)
CSW provided patient with info regarding [facility] with [employer] [place].   CSW received consult regarding SNF placement. Patient doing much better with PT now. Patient does not have insurance. Financial counseling assessed patient and he does not qualify for an LOG for SNF. If patient changes his mind and would like to pay privately for SNF, please re-consult CSW.  CSW signing off.  Osborne Cascoadia Jeovanny Cuadros LCSWA [phone]

## 2016-09-16 NOTE — Care Management Note (Addendum)
Case Management Note  Patient Details  Name: Jason Humphrey MRN: [id number] Date of Birth: 03-23-1955  Subjective/Objective:    Admitted with generalized weakness, hx of BPH, anemia. Pt from home alone.  PCP: Marga MelnickWilliam Hopper  Action/Plan: Plan is to d/c to home today. Pt states friend, Shirlee LimerickHolly Bailey, will assist him with care once d/c. Pt is to f/u with PCP IN 1-2 weeks. Pt states has transportation to home.    Expected Discharge Date:  09/16/16               Expected Discharge Plan:  Home w Home Health Services  In-House Referral:     Discharge planning Services  CM Consult  Post Acute Care Choice:    Choice offered to:  Patient  DME Arranged:    DME Agency:     HH Arranged:  RN, Nurse's Aide HH Agency:  [facility], referral made with Brad @ [phone] pending's order, however pt declined home health services  Status of Service:  Completed, signed off  If discussed at Long Length of Stay Meetings, dates discussed:    Additional Comments:  Epifanio LeschesCole, Lynnann Knudsen Hudson, RN 09/16/2016, 10:40 AM

## 2016-09-17 LAB — CULTURE, BLOOD (ROUTINE X 2)
CULTURE: NO GROWTH
Culture: NO GROWTH
SPECIAL REQUESTS: ADEQUATE
SPECIAL REQUESTS: ADEQUATE

## 2018-02-23 IMAGING — MR MR MRA EXTREM LOWER WO CM
3 of 6 series · 3 of 40 positions shown · non-contrast
Comparison: CT 09/12/2016

CLINICAL DATA: Pain and swelling of the extremity

EXAM:
MR MRA OF THE BILATERAL LOWER EXTREMITY WITHOUT CONTRAST
TECHNIQUE: Angiographic images of the extremity were obtained using MRA
technique without intravenous contrast.

[Series 750: processed images · sagittal · 2.4mm · 0.92mm/px · 1 of 11 slices shown (1 of 3)]
[im 1/11]
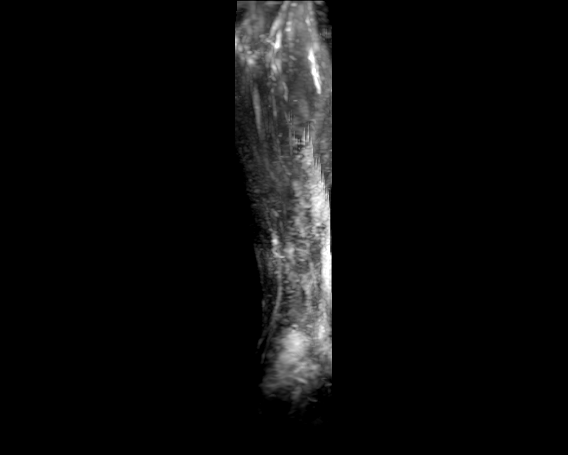

[Series 850: processed images · sagittal · 2.8mm · 0.92mm/px · 1 of 12 slices shown (2 of 3)]
[im 1/12]
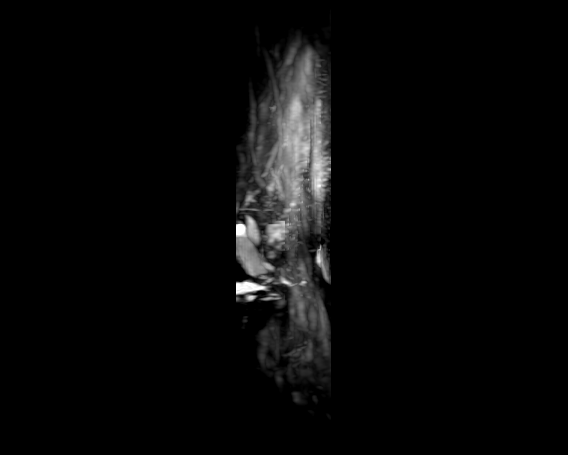

[Series 950: processed images · sagittal · 3.6mm · 0.92mm/px · 1 of 9 slices shown (3 of 3)]
[im 1/9]
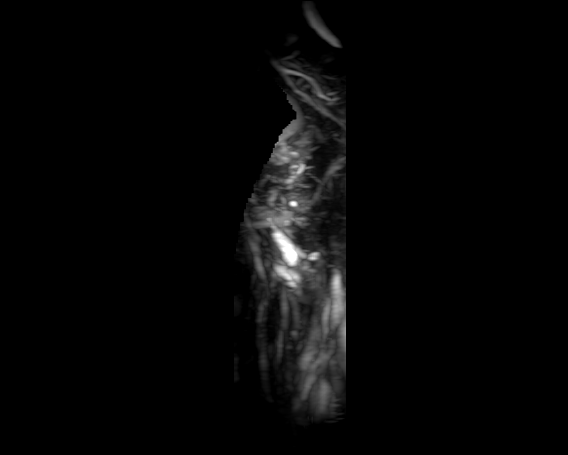

[3 of 40 positions shown; findings below may reference images not displayed]

FINDINGS: There is artifact at the level of the knees. There is excessive
motion artifact which significantly limits diagnostic quality of the
images. Lower extremity arterial system is only segmentally
visualized due to the excessive motion and artifact.

No obvious marrow edema within the imaged osseous structures. There
are bilateral knee effusions. Subcutaneous and soft tissue edema
within the bilateral thighs and lower legs, though poorly evaluated
due to single plane of imaging and significant artifacts as
described above.
IMPRESSION: Very limited study. Significant motion and artifact degradation
allows for only segmental visualization of the lower extremity
vasculature.Bilateral knee effusions, small. Subcutaneous and soft
tissue edema within the bilateral thighs and lower legs.

## 2018-08-21 IMAGING — US US ABDOMEN LIMITED
1 series · 14 of 25 positions shown · non-contrast
Comparison: CT 09/12/2016

CLINICAL DATA: Bilirubinemia for 1 day.

EXAM:
ULTRASOUND ABDOMEN LIMITED RIGHT UPPER QUADRANT

[Series 1: us abdomen limited · 0.28mm/px · 14 of 47 slices shown]
[im 1/47]
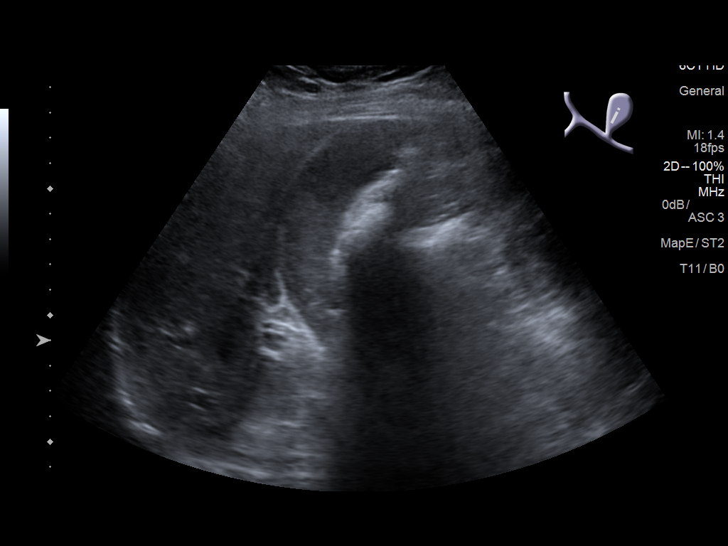
[im 4/47]
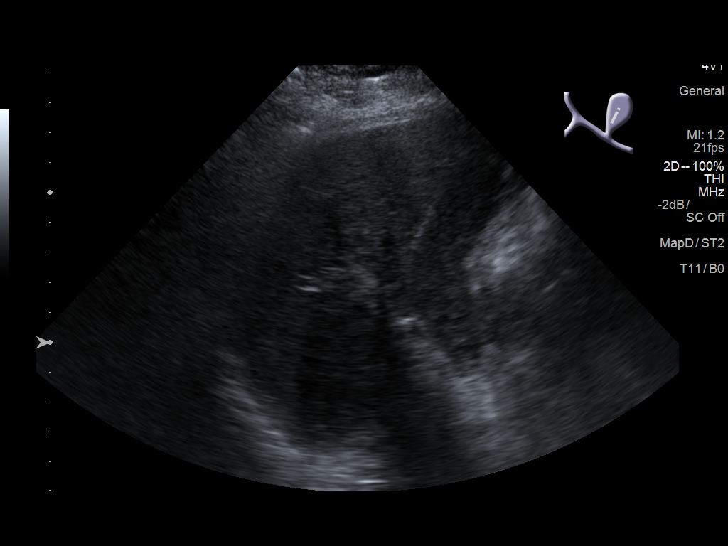
[im 8/47]
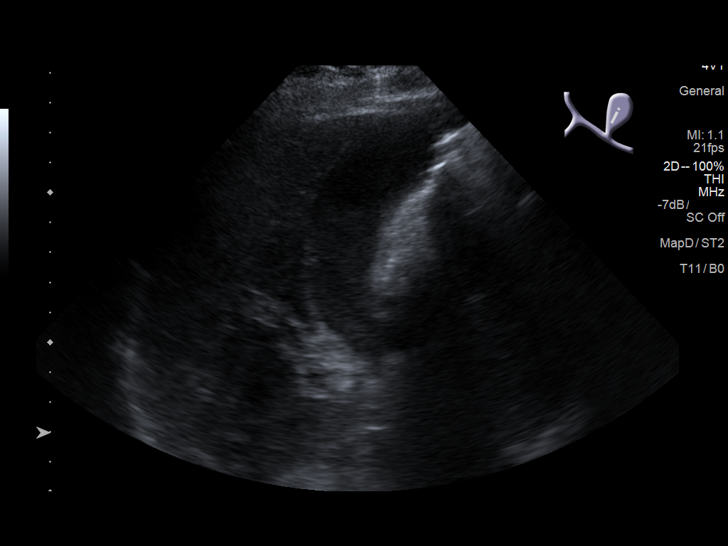
[im 12/47]
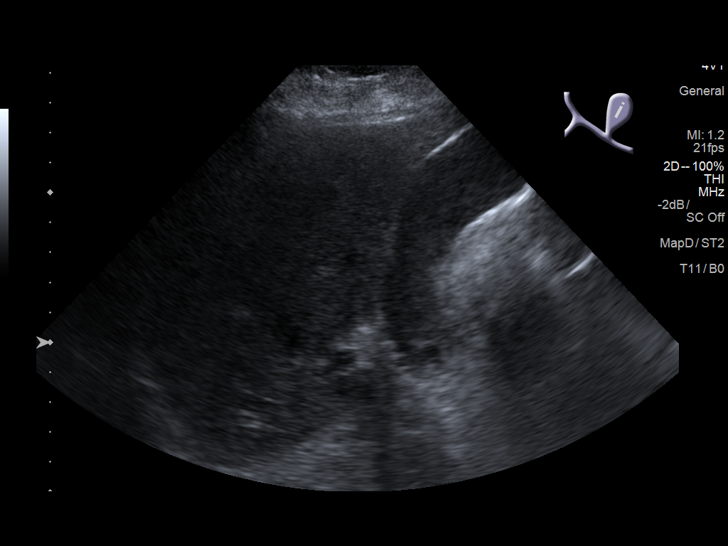
[im 16/47]
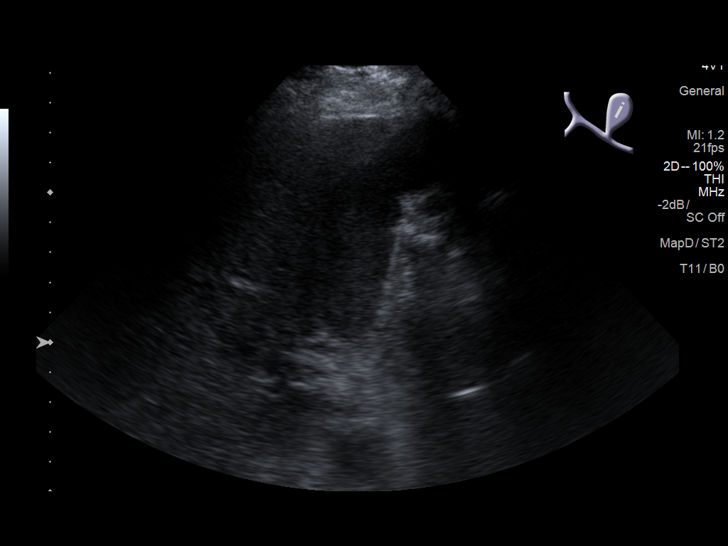
[im 18/47]
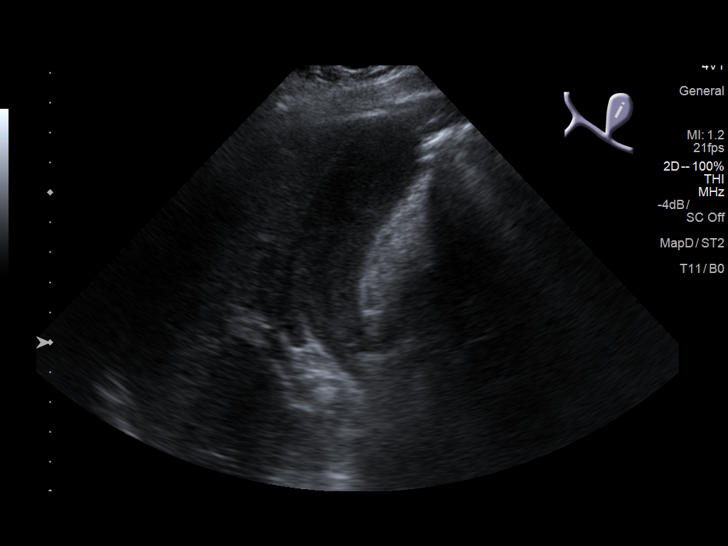
[im 22/47]
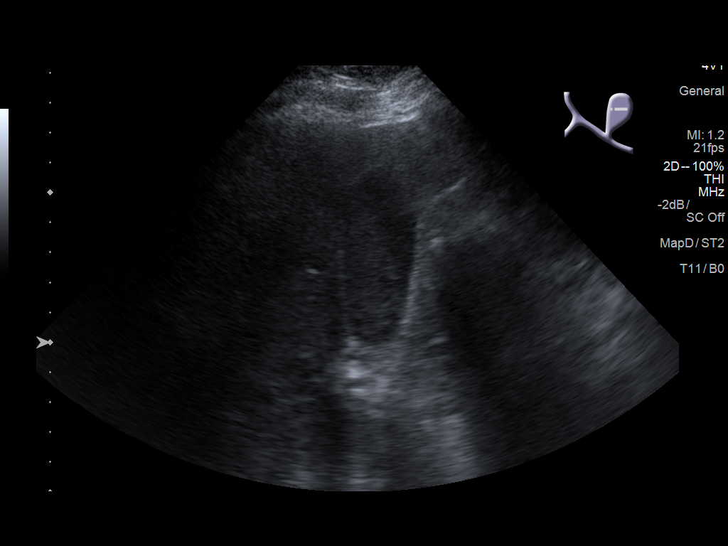
[im 25/47]
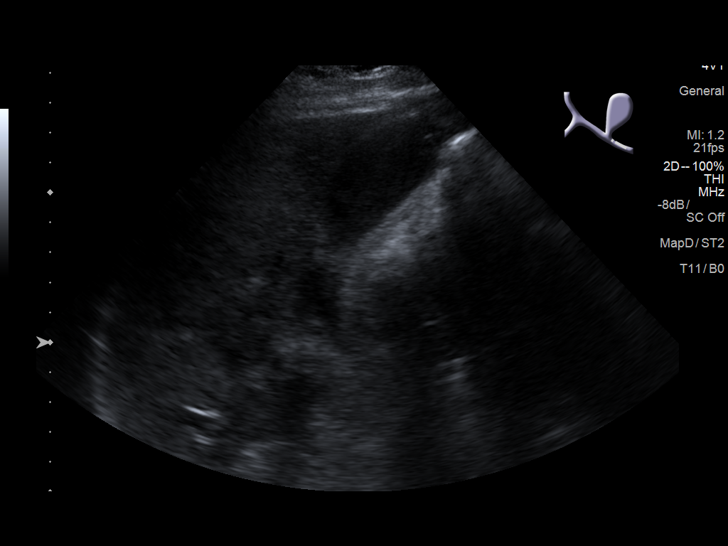
[im 29/47]
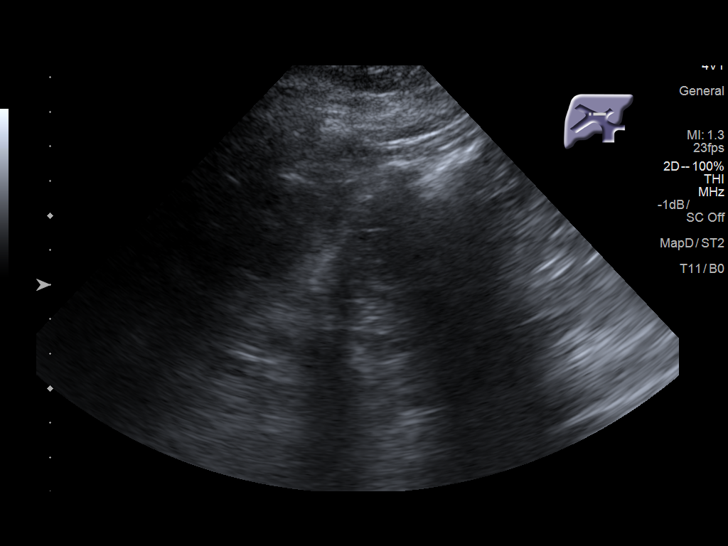
[im 31/47]
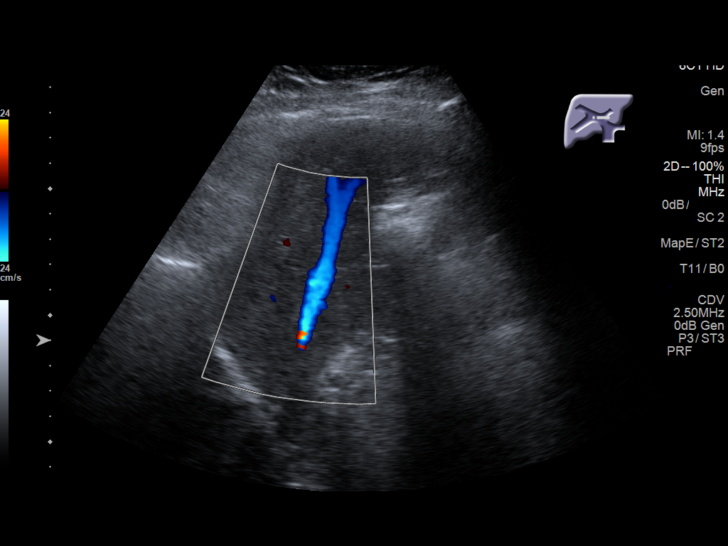
[im 35/47]
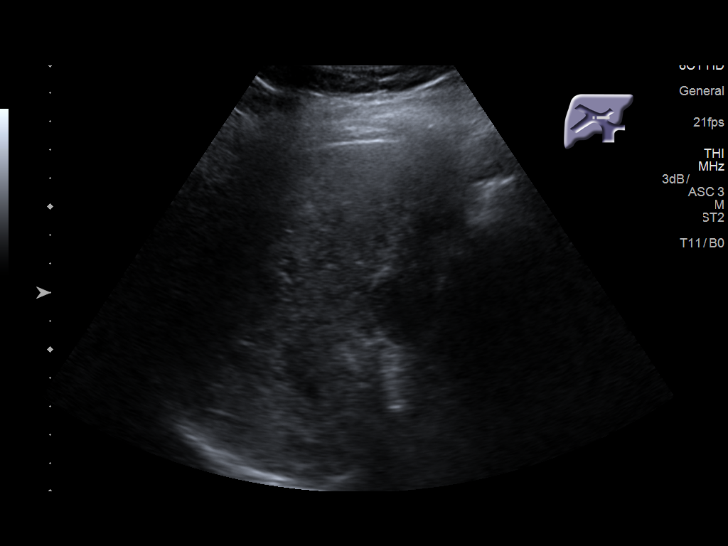
[im 39/47]
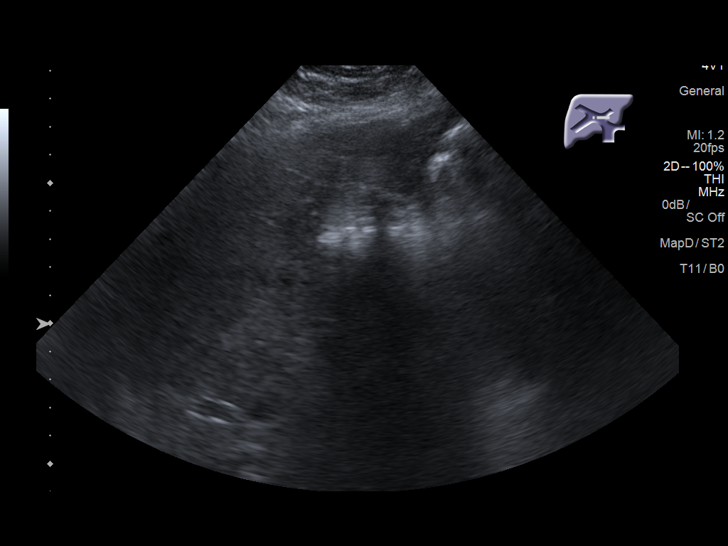
[im 43/47]
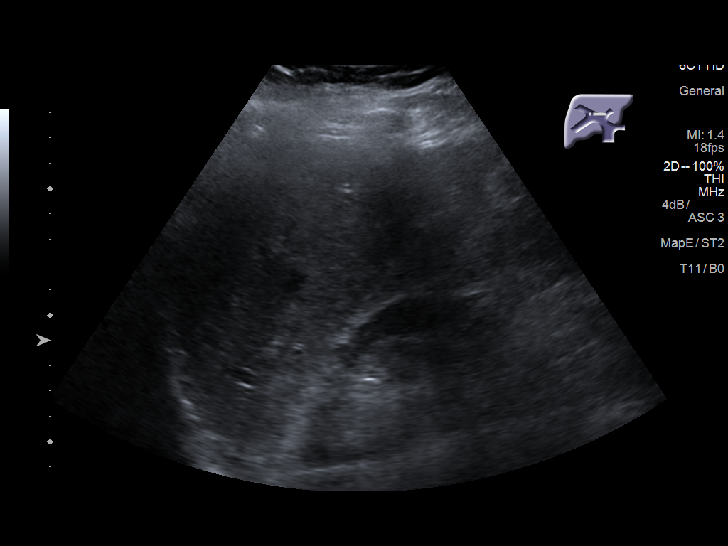
[im 47/47]
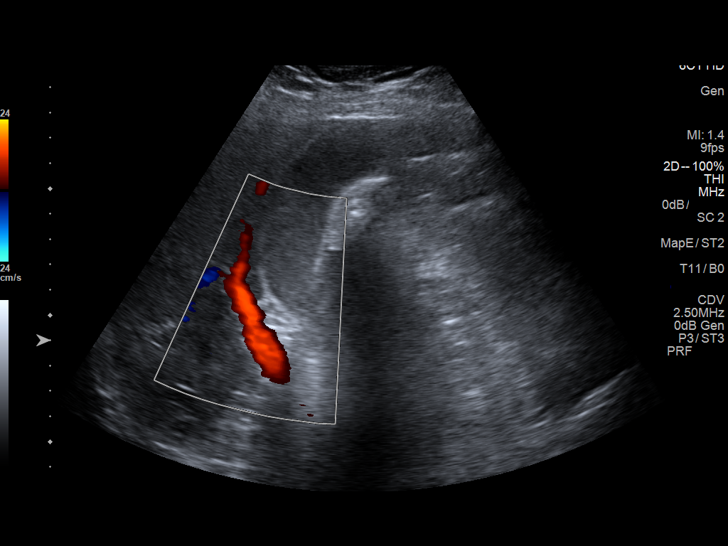

[14 of 25 positions shown; findings below may reference images not displayed]

FINDINGS: Gallbladder:

Small volume intraluminal sludge. No calculi. No mural thickening or
pericholecystic fluid. No tenderness to probe pressure.

Common bile duct:

Diameter: Normal, 4.1 mm.

Liver:

No focal lesion identified. Increased parenchymal echogenicity.
Portal vein is patent on color Doppler imaging with normal direction
of blood flow towards the liver.
IMPRESSION: Normal gallbladder and bile ducts. Mildly increased hepatic
parenchymal echogenicity may represent fatty infiltration or
hepatocellular disease.

## 2021-08-27 ENCOUNTER — Encounter (HOSPITAL_COMMUNITY): Payer: Self-pay

## 2021-08-27 ENCOUNTER — Other Ambulatory Visit: Payer: Self-pay

## 2021-08-27 ENCOUNTER — Emergency Department (HOSPITAL_COMMUNITY)
Admission: EM | Admit: 2021-08-27 | Discharge: 2021-08-28 | Disposition: A | Discharge status: Home / Self Care | Payer: Medicare Other | Attending: Emergency Medicine | Admitting: Emergency Medicine

## 2021-08-27 DIAGNOSIS — R339 Retention of urine, unspecified: Secondary | ICD-10-CM | POA: Insufficient documentation

## 2021-08-27 DIAGNOSIS — R300 Dysuria: Secondary | ICD-10-CM | POA: Insufficient documentation

## 2021-08-27 DIAGNOSIS — R109 Unspecified abdominal pain: Secondary | ICD-10-CM | POA: Diagnosis not present

## 2021-08-27 LAB — COMPREHENSIVE METABOLIC PANEL WITH GFR
ALT: 17 U/L (ref 0–44)
AST: 18 U/L (ref 15–41)
Albumin: 4.3 g/dL (ref 3.5–5.0)
Alkaline Phosphatase: 83 U/L (ref 38–126)
Anion gap: 7 (ref 5–15)
BUN: 17 mg/dL (ref 8–23)
CO2: 23 mmol/L (ref 22–32)
Calcium: 8.9 mg/dL (ref 8.9–10.3)
Chloride: 109 mmol/L (ref 98–111)
Creatinine, Ser: 0.81 mg/dL (ref 0.61–1.24)
GFR, Estimated: >60 mL/min (ref >60)
Glucose, Bld: 124 mg/dL — ABNORMAL HIGH (ref 70–99)
Potassium: 3.8 mmol/L (ref 3.5–5.1)
Sodium: 139 mmol/L (ref 135–145)
Total Bilirubin: 0.6 mg/dL (ref 0.3–1.2)
Total Protein: 7.7 g/dL (ref 6.5–8.1)

## 2021-08-27 LAB — URINALYSIS, ROUTINE W REFLEX MICROSCOPIC
Bacteria, UA: NONE SEEN
Bilirubin Urine: NEGATIVE
Glucose, UA: NEGATIVE mg/dL
Ketones, ur: NEGATIVE mg/dL
Leukocytes,Ua: NEGATIVE
Nitrite: NEGATIVE
Protein, ur: NEGATIVE mg/dL
Specific Gravity, Urine: 1.013 (ref 1.005–1.030)
pH: 5 (ref 5.0–8.0)

## 2021-08-27 LAB — CBC WITH DIFFERENTIAL/PLATELET
Abs Immature Granulocytes: 0.05 10*3/uL (ref 0.00–0.07)
Basophils Absolute: 0.1 10*3/uL (ref 0.0–0.1)
Basophils Relative: 1 %
Eosinophils Absolute: 0 10*3/uL (ref 0.0–0.5)
Eosinophils Relative: 0 %
HCT: 49.4 % (ref 39.0–52.0)
Hemoglobin: 16.6 g/dL (ref 13.0–17.0)
Immature Granulocytes: 1 %
Lymphocytes Relative: 10 %
Lymphs Abs: 1.1 10*3/uL (ref 0.7–4.0)
MCH: 30 pg (ref 26.0–34.0)
MCHC: 33.6 g/dL (ref 30.0–36.0)
MCV: 89.2 fL (ref 80.0–100.0)
Monocytes Absolute: 0.5 10*3/uL (ref 0.1–1.0)
Monocytes Relative: 5 %
Neutro Abs: 8.5 10*3/uL — ABNORMAL HIGH (ref 1.7–7.7)
Neutrophils Relative %: 83 %
Platelets: 297 10*3/uL (ref 150–400)
RBC: 5.54 MIL/uL (ref 4.22–5.81)
RDW: 14.2 % (ref 11.5–15.5)
WBC: 10.2 10*3/uL (ref 4.0–10.5)
nRBC: 0 % (ref 0.0–0.2)

## 2021-08-27 MED ORDER — TAMSULOSIN HCL 0.4 MG PO CAPS: 0.4000 mg | ORAL_CAPSULE | Freq: Every day | ORAL | 0 refills | Status: DC

## 2021-08-27 NOTE — ED Provider Notes (Signed)
[facility]-EMERGENCY DEPT Provider Note   CSN: [id number] Arrival date & time: 08/27/21  2134     History  Chief Complaint  Patient presents with   Urinary Retention    Jason Humphrey is a 66 y.o. male.  HPI   66 year old male presents emergency department with complaints of urinary retention. Patient states that he has not urinated since 1 AM this morning.  He notes some feelings of dysuria but denies hematuria.  States he has a history of enlarged prostate and has had symptoms similar in the past requiring catheterization temporarily but not for some time.  Denies fever nausea, vomiting.  Bladder scan showed greater than 999 cc.  Denies back pain, saddle anesthesia, weakness/sensory deficits in lower extremities, bowel dysfunction, fever, IV drug use history, steroid use.  Past medical history significant for BPH, diverticulosis, hyperlipidemia, anemia  Home Medications Prior to Admission medications   Medication Sig Start Date End Date Taking? Authorizing Provider  tamsulosin (FLOMAX) 0.4 MG CAPS capsule Take 1 capsule (0.4 mg total) by mouth daily. 08/27/21  Yes Sherian Maroon A, PA  Multiple Vitamin (MULTIVITAMIN WITH MINERALS) TABS tablet Take 1 tablet by mouth daily. 09/17/16   Clydia Llano, MD  vitamin B-12 (CYANOCOBALAMIN) 1000 MCG tablet Take 1 tablet (1,000 mcg total) by mouth daily. 09/16/16   Clydia Llano, MD      Allergies    Nabumetone    Review of Systems   Review of Systems  All other systems reviewed and are negative.   Physical Exam Updated Vital Signs BP (!) 204/120 (BP Location: Left Arm)   Pulse 63   Temp 98 F (36.7 C) (Oral)   Resp 18   Ht 5\' 10"  (1.778 m)   Wt 90.7 kg   SpO2 99%   BMI 28.70 kg/m  Physical Exam Vitals and nursing note reviewed.  Constitutional:      General: He is not in acute distress.    Appearance: He is well-developed.  HENT:     Head: Normocephalic and atraumatic.  Eyes:     Conjunctiva/sclera:  Conjunctivae normal.  Cardiovascular:     Rate and Rhythm: Normal rate and regular rhythm.     Heart sounds: No murmur heard. Pulmonary:     Effort: Pulmonary effort is normal. No respiratory distress.     Breath sounds: Normal breath sounds.  Abdominal:     Palpations: Abdomen is soft.     Tenderness: There is abdominal tenderness.     Comments: Abdominal tenderness and distention in the suprapubic region initially.  Musculoskeletal:        General: No swelling.     Cervical back: Neck supple.  Skin:    General: Skin is warm and dry.     Capillary Refill: Capillary refill takes less than 2 seconds.  Neurological:     Mental Status: He is alert.  Psychiatric:        Mood and Affect: Mood normal.     ED Results / Procedures / Treatments   Labs (all labs ordered are listed, but only abnormal results are displayed) Labs Reviewed  URINALYSIS, ROUTINE W REFLEX MICROSCOPIC - Abnormal; Notable for the following components:      Result Value   Hgb urine dipstick SMALL (*)    All other components within normal limits  COMPREHENSIVE METABOLIC PANEL - Abnormal; Notable for the following components:   Glucose, Bld 124 (*)    All other components within normal limits  CBC WITH DIFFERENTIAL/PLATELET -  Abnormal; Notable for the following components:   Neutro Abs 8.5 (*)    All other components within normal limits    EKG None  Radiology No results found.  Procedures Procedures    Medications Ordered in ED Medications - No data to display  ED Course/ Medical Decision Making/ A&P                           Medical Decision Making Amount and/or Complexity of Data Reviewed Labs: ordered.   This patient presents to the ED for concern of urinary retention, this involves an extensive number of treatment options, and is a complaint that carries with it a high risk of complications and morbidity.  The differential diagnosis includes polypharmacy, BPH, prostatitis, malignancy,  stricture, cauda equina,   Co morbidities that complicate the patient evaluation  See HPI   Additional history obtained:  Additional history obtained from EMR External records from outside source obtained and reviewed including right upper quadrant ultrasound from 09/13/2016   Lab Tests:  I Ordered, and personally interpreted labs.  The pertinent results include: No leukocytosis.  No evidence of anemia.  No electrolyte abnormality.  Renal function within normal limits.  No abnormality in hepatic function.  Urinalysis without evidence of infection or hematuria.   Imaging Studies ordered:  I ordered imaging studies including bladder scan I independently visualized and interpreted imaging which showed greater than 1000 cc of retention I agree with the radiologist interpretation   Cardiac Monitoring: / EKG:  The patient was maintained on a cardiac monitor.  I personally viewed and interpreted the cardiac monitored which showed an underlying rhythm of: Sinus rhythm   Consultations Obtained:  N/a   Problem List / ED Course / Critical interventions / Medication management  Reevaluation of the patient  showed that the patient resolved I have reviewed the patients home medicines and have made adjustments as needed   Social Determinants of Health:  Former cigarette use.  Denies illicit drug use.   Test / Admission - Considered:  Urinary retention Vitals signs significant for hypertension initially with a blood pressure 204/120.  It is secondary to patient's pain.  After catheter was, patient's blood pressure significantly decreased.. Otherwise within normal range and stable throughout visit. Laboratory/imaging studies significant for: See above Patient symptoms most likely secondary to mechanical obstruction of bladder secondary to BPH.  Given patient's history of similar symptoms in the past and lack of findings consistent with neurogenic bladder, doubt spinal cord  compression.  Patient to be discharged with leg bag and Flomax until he is able to follow-up with urologist.  Patient was educated regarding proper Foley catheter care.  Treatment plan was discussed at length with patient and he knowledge understanding was agreeable to said plan. Worrisome signs and symptoms were discussed with the patient, and the patient acknowledged understanding to return to the ED if noticed. Patient was stable upon discharge.         Final Clinical Impression(s) / ED Diagnoses Final diagnoses:  Urinary retention    Rx / DC Orders ED Discharge Orders          Ordered    Ambulatory referral to Urology       Comments: Urinary retention requiring foley   08/27/21 2345    tamsulosin (FLOMAX) 0.4 MG CAPS capsule  Daily        08/27/21 2345  Peter Garter, [place] 08/27/21 2345    Phoebe Sharps, DO 08/27/21 2350

## 2021-08-27 NOTE — ED Triage Notes (Addendum)
Last urinated @ 0100, burning with urination. Denies fever Bladder scan >999 Pt reports hx of enlarged prostate.

## 2021-08-27 NOTE — ED Provider Triage Note (Signed)
Emergency Medicine Provider Triage Evaluation Note  Jason Humphrey , a 66 y.o. male  was evaluated in triage.  Pt complains of urinary retention.  Patient states that he has not urinated since 1 AM this morning.  He notes some feelings of dysuria but denies hematuria.  States he has a history of enlarged prostate and has had symptoms similar in the past but not for some time.  Denies fever nausea, vomiting.  Bladder scan showed greater than 999 cc.  Review of Systems  Positive: See above Negative:   Physical Exam  BP (!) 204/120 (BP Location: Left Arm)   Pulse 63   Temp 98 F (36.7 C) (Oral)   Resp 18   Ht 5\' 10"  (1.778 m)   Wt 90.7 kg   SpO2 99%   BMI 28.70 kg/m  Gen:   Awake, no distress   Resp:  Normal effort  MSK:   Moves extremities without difficulty  Other:  Patient distended in suprapubic area with associated tenderness.  Medical Decision Making  Medically screening exam initiated at 10:01 PM.  Appropriate orders placed.  Jason Humphrey was informed that the remainder of the evaluation will be completed by another provider, this initial triage assessment does not replace that evaluation, and the importance of remaining in the ED until their evaluation is complete.     [name], Peter Garter 08/27/21 2202

## 2021-08-27 NOTE — Discharge Instructions (Addendum)
Note your work-up today was overall consistent with urinary retention.  This is most likely caused by your BPH.  We have treated this in the emergency department with a Foley catheter.  You will be discharged with a leg bag.  Read the attached instructions concerning proper Foley catheter care to avoid complications.  I will also prescribe Flomax to take daily.  Keep the catheter in place until you are able to follow-up with urology.  Information attached.  I have also sent in a referral so you should hear from them within the next 1 to 3 days.  Please do not hesitate to return to the emergency department if the worrisome signs and symptoms we discussed become apparent.

## 2021-08-28 MED ORDER — TAMSULOSIN HCL 0.4 MG PO CAPS: 0.4000 mg | ORAL_CAPSULE | Freq: Every day | ORAL | 0 refills | Status: AC

## 2021-09-04 ENCOUNTER — Other Ambulatory Visit: Payer: Self-pay

## 2021-09-04 ENCOUNTER — Encounter (HOSPITAL_COMMUNITY): Payer: Self-pay | Admitting: Emergency Medicine

## 2021-09-04 ENCOUNTER — Ambulatory Visit: Payer: Medicare Other | Admitting: Urology

## 2021-09-04 ENCOUNTER — Emergency Department (HOSPITAL_COMMUNITY)
Admission: EM | Admit: 2021-09-04 | Discharge: 2021-09-04 | Disposition: A | Discharge status: Home / Self Care | Payer: Medicare Other | Attending: Emergency Medicine | Admitting: Emergency Medicine

## 2021-09-04 DIAGNOSIS — R350 Frequency of micturition: Secondary | ICD-10-CM | POA: Diagnosis present

## 2021-09-04 DIAGNOSIS — N139 Obstructive and reflux uropathy, unspecified: Secondary | ICD-10-CM | POA: Insufficient documentation

## 2021-09-04 NOTE — Progress Notes (Incomplete)
09/04/21 8:42 AM   [name] Apr 15, 1955 [id number]  Referring provider:  Peter Garter, PA [address] [place],  [place] [address] No chief complaint on file.      HPI: Jason Humphrey is a 66 y.o.male who presents today for further evaluation of urinary retention.  He was seen in the ED on 08/27/2021 with complaints of urinary retention and he had not urinated since 1:00 AM that morning.He was noted to have felt some dysuria .He has a history of enlarged prostate and has had similar symptoms in the past, requiring catheterization temporarily.His bladder scan showed 999 CC's in  the bladder. Urinalysis had a small HgB urine dipstick but was otherwise unremarkable. His creatinine was within normal limits at 0.81. A foley was placed and he was discharged with a leg bag and Flomax until he was able to follow up here with your urology.        PMH: Past Medical History:  Diagnosis Date   Benign prostatic hypertrophy    Diverticulosis of colon    History of chest pain 05/2005   Nuclear stress test neg for ischemia (?mild LVH, ? low normal EF)   Hyperlipidemia     Surgical History: Past Surgical History:  Procedure Laterality Date   APPENDECTOMY     COLONOSCOPY  12/2003   Diverticulosis, Dr.George [place]    TONSILLECTOMY      Home Medications:  Allergies as of 09/04/2021       Reactions   Nabumetone Palpitations, Other (See Comments)   Tachycardia        Medication List        Accurate as of September 04, 2021  8:42 AM. If you have any questions, ask your nurse or doctor.          cyanocobalamin 1000 MCG tablet Commonly known as: VITAMIN B12 Take 1 tablet (1,000 mcg total) by mouth daily.   multivitamin with minerals Tabs tablet Take 1 tablet by mouth daily.   tamsulosin 0.4 MG Caps capsule Commonly known as: FLOMAX Take 1 capsule (0.4 mg total) by mouth daily.        Allergies:  Allergies  Allergen Reactions   Nabumetone Palpitations  and Other (See Comments)    Tachycardia     Family History: Family History  Problem Relation Age of Onset   Parkinsonism Father    Neuropathy Mother    Hypertension Mother    Alcohol abuse Paternal Grandfather    Hypertension Paternal Uncle     Social History:  reports that he quit smoking about 33 years ago. He has never used smokeless tobacco. He reports that he does not drink alcohol and does not use drugs.   Physical Exam: There were no vitals taken for this visit.  Constitutional:  Alert and oriented, No acute distress. HEENT: [place] AT, moist mucus membranes.  Trachea midline, no masses. Cardiovascular: No clubbing, cyanosis, or edema. Respiratory: Normal respiratory effort, no increased work of breathing. Skin: No rashes, bruises or suspicious lesions. Neurologic: Grossly intact, no focal deficits, moving all 4 extremities. Psychiatric: Normal mood and affect.  Laboratory Data:  Lab Results  Component Value Date   CREATININE 0.81 08/27/2021   Lab Results  Component Value Date   HGBA1C 4.1 (L) 09/13/2016    Urinalysis   Pertinent Imaging:    Assessment & Plan:     No follow-ups on file.  I,Kailey Littlejohn,acting as a Neurosurgeon for Vanna Scotland, MD.,have documented all relevant documentation on  the behalf of Vanna Scotland, MD,as directed by  Vanna Scotland, MD while in the presence of Vanna Scotland, MD.   [facility] [address], [address] [place], [place] [address] [phone]

## 2021-09-04 NOTE — ED Triage Notes (Signed)
Pt reports needing catheter removed.  

## 2021-09-04 NOTE — Discharge Instructions (Signed)
The catheter has been removed.  Watch for inability urinate.  If you cannot urinate you may need to return.  Follow-up with urology for further evaluation of this.

## 2021-09-04 NOTE — ED Provider Notes (Signed)
  [facility]-EMERGENCY DEPT Provider Note   CSN: [id number] Arrival date & time: 09/04/21  1417     History  Chief Complaint  Patient presents with   Catheter Removal     Jason Humphrey is a 66 y.o. male.  HPI Patient presents requesting removal of his Foley catheter.  Was placed on the 16th.  Has been having urine come out through the catheter.  Some blood and states that is from the tube being pulled on.  Has not followed with urology.  States he did the episode years ago when he was in the hospital he had retention but otherwise does not issue.  Gets up frequently to urinate at night.  States he keeps 1/2 gallon bottle at the side of the bed to be able to urinate into.  No fevers.    Home Medications Prior to Admission medications   Medication Sig Start Date End Date Taking? Authorizing Provider  Multiple Vitamin (MULTIVITAMIN WITH MINERALS) TABS tablet Take 1 tablet by mouth daily. 09/17/16   Clydia Llano, MD  tamsulosin (FLOMAX) 0.4 MG CAPS capsule Take 1 capsule (0.4 mg total) by mouth daily. 08/28/21   Peter Garter, PA  vitamin B-12 (CYANOCOBALAMIN) 1000 MCG tablet Take 1 tablet (1,000 mcg total) by mouth daily. 09/16/16   Clydia Llano, MD      Allergies    Nabumetone    Review of Systems   Review of Systems  Physical Exam Updated Vital Signs BP 133/89 (BP Location: Left Arm)   Pulse 85   Temp 97.9 F (36.6 C) (Oral)   Resp 18   SpO2 100%  Physical Exam Vitals and nursing note reviewed.  Cardiovascular:     Rate and Rhythm: Regular rhythm.  Abdominal:     Tenderness: There is no abdominal tenderness.  Genitourinary:    Comments: Foley catheter in place. Musculoskeletal:     Cervical back: Neck supple.  Skin:    Capillary Refill: Capillary refill takes less than 2 seconds.  Neurological:     Mental Status: He is alert and oriented to person, place, and time.     ED Results / Procedures / Treatments   Labs (all labs ordered are  listed, but only abnormal results are displayed) Labs Reviewed - No data to display  EKG None  Radiology No results found.  Procedures Procedures    Medications Ordered in ED Medications - No data to display  ED Course/ Medical Decision Making/ A&P                           Medical Decision Making    Patient with Foley catheter removal.  Requesting removal after about 8 days of placement.  Does have some blood in the bag.  Reviewed recent note.  Has not followed with urology.  Will remove.  Discussed with patient about waiting for urinary trial but is eager to go home.  Will discharge and return for worsening symptoms.        Final Clinical Impression(s) / ED Diagnoses Final diagnoses:  Urinary obstruction    Rx / DC Orders ED Discharge Orders     None         Benjiman Core, MD 09/04/21 1528

## 2021-09-05 DIAGNOSIS — R338 Other retention of urine: Secondary | ICD-10-CM | POA: Diagnosis not present

## 2021-09-05 DIAGNOSIS — N3000 Acute cystitis without hematuria: Secondary | ICD-10-CM | POA: Diagnosis not present

## 2021-09-24 DIAGNOSIS — Z79899 Other long term (current) drug therapy: Secondary | ICD-10-CM | POA: Diagnosis not present

## 2021-09-24 DIAGNOSIS — S81811A Laceration without foreign body, right lower leg, initial encounter: Secondary | ICD-10-CM | POA: Diagnosis not present

## 2021-09-24 DIAGNOSIS — R339 Retention of urine, unspecified: Secondary | ICD-10-CM | POA: Diagnosis not present

## 2021-10-06 DIAGNOSIS — R339 Retention of urine, unspecified: Secondary | ICD-10-CM | POA: Diagnosis not present

## 2021-10-06 DIAGNOSIS — N481 Balanitis: Secondary | ICD-10-CM | POA: Diagnosis not present

## 2021-10-06 DIAGNOSIS — N390 Urinary tract infection, site not specified: Secondary | ICD-10-CM | POA: Diagnosis not present

## 2021-10-09 DIAGNOSIS — R339 Retention of urine, unspecified: Secondary | ICD-10-CM | POA: Diagnosis not present

## 2021-10-16 DIAGNOSIS — R339 Retention of urine, unspecified: Secondary | ICD-10-CM | POA: Diagnosis not present

## 2021-10-16 DIAGNOSIS — N481 Balanitis: Secondary | ICD-10-CM | POA: Diagnosis not present

## 2021-10-16 DIAGNOSIS — N390 Urinary tract infection, site not specified: Secondary | ICD-10-CM | POA: Diagnosis not present

## 2022-01-21 DIAGNOSIS — N481 Balanitis: Secondary | ICD-10-CM | POA: Diagnosis not present

## 2022-01-21 DIAGNOSIS — R339 Retention of urine, unspecified: Secondary | ICD-10-CM | POA: Diagnosis not present

## 2022-01-21 DIAGNOSIS — N390 Urinary tract infection, site not specified: Secondary | ICD-10-CM | POA: Diagnosis not present

## 2022-09-10 DIAGNOSIS — Z23 Encounter for immunization: Secondary | ICD-10-CM | POA: Diagnosis not present

## 2022-09-10 DIAGNOSIS — R600 Localized edema: Secondary | ICD-10-CM | POA: Diagnosis not present

## 2022-09-25 DIAGNOSIS — M25571 Pain in right ankle and joints of right foot: Secondary | ICD-10-CM | POA: Diagnosis not present

## 2022-09-26 DIAGNOSIS — Z79899 Other long term (current) drug therapy: Secondary | ICD-10-CM | POA: Diagnosis not present

## 2022-09-26 DIAGNOSIS — R1312 Dysphagia, oropharyngeal phase: Secondary | ICD-10-CM | POA: Diagnosis not present

## 2022-09-26 DIAGNOSIS — Z23 Encounter for immunization: Secondary | ICD-10-CM | POA: Diagnosis not present

## 2022-09-26 DIAGNOSIS — M25462 Effusion, left knee: Secondary | ICD-10-CM | POA: Diagnosis not present

## 2022-09-26 DIAGNOSIS — R262 Difficulty in walking, not elsewhere classified: Secondary | ICD-10-CM | POA: Diagnosis not present

## 2022-09-26 DIAGNOSIS — S82202A Unspecified fracture of shaft of left tibia, initial encounter for closed fracture: Secondary | ICD-10-CM | POA: Diagnosis not present

## 2022-09-26 DIAGNOSIS — Z7401 Bed confinement status: Secondary | ICD-10-CM | POA: Diagnosis not present

## 2022-09-26 DIAGNOSIS — R279 Unspecified lack of coordination: Secondary | ICD-10-CM | POA: Diagnosis not present

## 2022-09-26 DIAGNOSIS — W19XXXA Unspecified fall, initial encounter: Secondary | ICD-10-CM | POA: Diagnosis not present

## 2022-09-26 DIAGNOSIS — F17200 Nicotine dependence, unspecified, uncomplicated: Secondary | ICD-10-CM | POA: Diagnosis not present

## 2022-09-26 DIAGNOSIS — D692 Other nonthrombocytopenic purpura: Secondary | ICD-10-CM | POA: Diagnosis not present

## 2022-09-26 DIAGNOSIS — Z79891 Long term (current) use of opiate analgesic: Secondary | ICD-10-CM | POA: Diagnosis not present

## 2022-09-26 DIAGNOSIS — M25562 Pain in left knee: Secondary | ICD-10-CM | POA: Diagnosis not present

## 2022-09-26 DIAGNOSIS — F17210 Nicotine dependence, cigarettes, uncomplicated: Secondary | ICD-10-CM | POA: Diagnosis not present

## 2022-09-26 DIAGNOSIS — W108XXA Fall (on) (from) other stairs and steps, initial encounter: Secondary | ICD-10-CM | POA: Diagnosis not present

## 2022-09-26 DIAGNOSIS — S82142A Displaced bicondylar fracture of left tibia, initial encounter for closed fracture: Secondary | ICD-10-CM | POA: Diagnosis not present

## 2022-09-26 DIAGNOSIS — Z888 Allergy status to other drugs, medicaments and biological substances status: Secondary | ICD-10-CM | POA: Diagnosis not present

## 2022-09-26 DIAGNOSIS — S82202D Unspecified fracture of shaft of left tibia, subsequent encounter for closed fracture with routine healing: Secondary | ICD-10-CM | POA: Diagnosis not present

## 2022-09-26 DIAGNOSIS — R269 Unspecified abnormalities of gait and mobility: Secondary | ICD-10-CM | POA: Diagnosis not present

## 2022-09-29 DIAGNOSIS — R1312 Dysphagia, oropharyngeal phase: Secondary | ICD-10-CM | POA: Diagnosis not present

## 2022-09-29 DIAGNOSIS — Z79899 Other long term (current) drug therapy: Secondary | ICD-10-CM | POA: Diagnosis not present

## 2022-09-29 DIAGNOSIS — S82202A Unspecified fracture of shaft of left tibia, initial encounter for closed fracture: Secondary | ICD-10-CM | POA: Diagnosis not present

## 2022-09-29 DIAGNOSIS — F17200 Nicotine dependence, unspecified, uncomplicated: Secondary | ICD-10-CM | POA: Diagnosis not present

## 2022-09-29 DIAGNOSIS — Z79891 Long term (current) use of opiate analgesic: Secondary | ICD-10-CM | POA: Diagnosis not present

## 2022-09-29 DIAGNOSIS — Z888 Allergy status to other drugs, medicaments and biological substances status: Secondary | ICD-10-CM | POA: Diagnosis not present

## 2022-09-29 DIAGNOSIS — R262 Difficulty in walking, not elsewhere classified: Secondary | ICD-10-CM | POA: Diagnosis not present

## 2022-09-29 DIAGNOSIS — R279 Unspecified lack of coordination: Secondary | ICD-10-CM | POA: Diagnosis not present

## 2022-09-29 DIAGNOSIS — S82202D Unspecified fracture of shaft of left tibia, subsequent encounter for closed fracture with routine healing: Secondary | ICD-10-CM | POA: Diagnosis not present

## 2022-09-29 DIAGNOSIS — D692 Other nonthrombocytopenic purpura: Secondary | ICD-10-CM | POA: Diagnosis not present

## 2022-09-29 DIAGNOSIS — Z7401 Bed confinement status: Secondary | ICD-10-CM | POA: Diagnosis not present

## 2022-09-29 DIAGNOSIS — S82142A Displaced bicondylar fracture of left tibia, initial encounter for closed fracture: Secondary | ICD-10-CM | POA: Diagnosis not present

## 2022-09-29 DIAGNOSIS — Z23 Encounter for immunization: Secondary | ICD-10-CM | POA: Diagnosis not present

## 2022-09-29 DIAGNOSIS — F17210 Nicotine dependence, cigarettes, uncomplicated: Secondary | ICD-10-CM | POA: Diagnosis not present

## 2022-09-29 DIAGNOSIS — R269 Unspecified abnormalities of gait and mobility: Secondary | ICD-10-CM | POA: Diagnosis not present

## 2022-09-30 DIAGNOSIS — F17200 Nicotine dependence, unspecified, uncomplicated: Secondary | ICD-10-CM | POA: Diagnosis not present

## 2022-09-30 DIAGNOSIS — R262 Difficulty in walking, not elsewhere classified: Secondary | ICD-10-CM | POA: Diagnosis not present

## 2022-09-30 DIAGNOSIS — S82202A Unspecified fracture of shaft of left tibia, initial encounter for closed fracture: Secondary | ICD-10-CM | POA: Diagnosis not present

## 2022-09-30 DIAGNOSIS — D692 Other nonthrombocytopenic purpura: Secondary | ICD-10-CM | POA: Diagnosis not present

## 2022-10-05 DIAGNOSIS — S82142A Displaced bicondylar fracture of left tibia, initial encounter for closed fracture: Secondary | ICD-10-CM | POA: Diagnosis not present

## 2022-10-07 DIAGNOSIS — R262 Difficulty in walking, not elsewhere classified: Secondary | ICD-10-CM | POA: Diagnosis not present

## 2022-10-07 DIAGNOSIS — D692 Other nonthrombocytopenic purpura: Secondary | ICD-10-CM | POA: Diagnosis not present

## 2022-10-07 DIAGNOSIS — S82202A Unspecified fracture of shaft of left tibia, initial encounter for closed fracture: Secondary | ICD-10-CM | POA: Diagnosis not present

## 2022-10-09 DIAGNOSIS — R262 Difficulty in walking, not elsewhere classified: Secondary | ICD-10-CM | POA: Diagnosis not present

## 2022-10-09 DIAGNOSIS — D692 Other nonthrombocytopenic purpura: Secondary | ICD-10-CM | POA: Diagnosis not present

## 2022-10-09 DIAGNOSIS — S82202A Unspecified fracture of shaft of left tibia, initial encounter for closed fracture: Secondary | ICD-10-CM | POA: Diagnosis not present

## 2022-10-12 ENCOUNTER — Ambulatory Visit (INDEPENDENT_AMBULATORY_CARE_PROVIDER_SITE_OTHER): Payer: 59 | Admitting: Podiatry

## 2022-10-12 DIAGNOSIS — Z91199 Patient's noncompliance with other medical treatment and regimen due to unspecified reason: Secondary | ICD-10-CM

## 2022-10-12 NOTE — Progress Notes (Signed)
 Patient absent for apointment

## 2022-10-20 DIAGNOSIS — S82142A Displaced bicondylar fracture of left tibia, initial encounter for closed fracture: Secondary | ICD-10-CM | POA: Diagnosis not present

## 2022-10-26 DIAGNOSIS — Z79899 Other long term (current) drug therapy: Secondary | ICD-10-CM | POA: Diagnosis not present

## 2022-10-26 DIAGNOSIS — M7989 Other specified soft tissue disorders: Secondary | ICD-10-CM | POA: Diagnosis not present

## 2022-10-26 DIAGNOSIS — S82142A Displaced bicondylar fracture of left tibia, initial encounter for closed fracture: Secondary | ICD-10-CM | POA: Diagnosis not present

## 2022-10-26 DIAGNOSIS — F17210 Nicotine dependence, cigarettes, uncomplicated: Secondary | ICD-10-CM | POA: Diagnosis not present

## 2022-10-26 DIAGNOSIS — R339 Retention of urine, unspecified: Secondary | ICD-10-CM | POA: Diagnosis not present

## 2022-10-26 DIAGNOSIS — G8918 Other acute postprocedural pain: Secondary | ICD-10-CM | POA: Diagnosis not present

## 2022-10-26 DIAGNOSIS — R600 Localized edema: Secondary | ICD-10-CM | POA: Diagnosis not present

## 2022-10-27 DIAGNOSIS — Z79899 Other long term (current) drug therapy: Secondary | ICD-10-CM | POA: Diagnosis not present

## 2022-10-27 DIAGNOSIS — R339 Retention of urine, unspecified: Secondary | ICD-10-CM | POA: Diagnosis not present

## 2022-10-27 DIAGNOSIS — S82142A Displaced bicondylar fracture of left tibia, initial encounter for closed fracture: Secondary | ICD-10-CM | POA: Diagnosis not present

## 2022-10-27 DIAGNOSIS — F17210 Nicotine dependence, cigarettes, uncomplicated: Secondary | ICD-10-CM | POA: Diagnosis not present

## 2022-11-14 DIAGNOSIS — I10 Essential (primary) hypertension: Secondary | ICD-10-CM | POA: Diagnosis not present

## 2022-11-14 DIAGNOSIS — R338 Other retention of urine: Secondary | ICD-10-CM | POA: Diagnosis not present

## 2022-11-14 DIAGNOSIS — R339 Retention of urine, unspecified: Secondary | ICD-10-CM | POA: Diagnosis not present

## 2022-11-14 DIAGNOSIS — W19XXXA Unspecified fall, initial encounter: Secondary | ICD-10-CM | POA: Diagnosis not present

## 2022-11-24 DIAGNOSIS — R339 Retention of urine, unspecified: Secondary | ICD-10-CM | POA: Diagnosis not present

## 2022-11-30 DIAGNOSIS — R339 Retention of urine, unspecified: Secondary | ICD-10-CM | POA: Diagnosis not present

## 2022-12-07 DIAGNOSIS — R339 Retention of urine, unspecified: Secondary | ICD-10-CM | POA: Diagnosis not present

## 2023-02-05 DIAGNOSIS — R339 Retention of urine, unspecified: Secondary | ICD-10-CM | POA: Diagnosis not present

## 2023-02-12 DIAGNOSIS — S82142A Displaced bicondylar fracture of left tibia, initial encounter for closed fracture: Secondary | ICD-10-CM | POA: Diagnosis not present
